# Patient Record
Sex: Female | Born: 1990 | Race: White | Hispanic: No | Marital: Married | Smoking: Never smoker
Health system: Southern US, Community
[De-identification: ages and names within clinical notes are randomized; demographics above are authoritative.]

## PROBLEM LIST (undated history)

## (undated) ENCOUNTER — Inpatient Hospital Stay (HOSPITAL_COMMUNITY): Payer: Self-pay

## (undated) DIAGNOSIS — Z789 Other specified health status: Secondary | ICD-10-CM

## (undated) DIAGNOSIS — D649 Anemia, unspecified: Secondary | ICD-10-CM

## (undated) HISTORY — PX: URETHRAL CYST REMOVAL: SHX5128

## (undated) HISTORY — PX: NO PAST SURGERIES: SHX2092

---

## 2017-04-21 ENCOUNTER — Emergency Department (HOSPITAL_COMMUNITY)
Admission: EM | Admit: 2017-04-21 | Discharge: 2017-04-21 | Disposition: A | Discharge status: Home / Self Care | Payer: BLUE CROSS/BLUE SHIELD | Attending: Emergency Medicine | Admitting: Emergency Medicine

## 2017-04-21 ENCOUNTER — Encounter (HOSPITAL_COMMUNITY): Payer: Self-pay

## 2017-04-21 DIAGNOSIS — H9202 Otalgia, left ear: Secondary | ICD-10-CM | POA: Diagnosis present

## 2017-04-21 DIAGNOSIS — H66002 Acute suppurative otitis media without spontaneous rupture of ear drum, left ear: Secondary | ICD-10-CM | POA: Insufficient documentation

## 2017-04-21 MED ORDER — AMOXICILLIN 500 MG PO CAPS
1000.0000 mg | ORAL_CAPSULE | Freq: Once | ORAL | Status: AC
  Administered 2017-04-21: 1000 mg via ORAL
  Filled 2017-04-21: qty 2

## 2017-04-21 MED ORDER — AMOXICILLIN 500 MG PO CAPS: 1000.0000 mg | ORAL_CAPSULE | Freq: Two times a day (BID) | ORAL | 0 refills | Status: DC

## 2017-04-21 MED ORDER — ACETAMINOPHEN 500 MG PO TABS
1000.0000 mg | ORAL_TABLET | Freq: Once | ORAL | Status: AC
  Administered 2017-04-21: 1000 mg via ORAL
  Filled 2017-04-21: qty 2

## 2017-04-21 MED ORDER — KETOROLAC TROMETHAMINE 60 MG/2ML IM SOLN
30.0000 mg | Freq: Once | INTRAMUSCULAR | Status: AC
  Administered 2017-04-21: 30 mg via INTRAMUSCULAR
  Filled 2017-04-21: qty 2

## 2017-04-21 NOTE — ED Notes (Signed)
Bed: [facility]PT1 Expected date:  Expected time:  Means of arrival:  Comments: 

## 2017-04-21 NOTE — ED Provider Notes (Signed)
[facility]   CSN: [id number] Arrival date & time: 04/21/17  0202     History   Chief Complaint Chief Complaint  Patient presents with  . Otalgia   HPI   Blood pressure 118/81, pulse 76, temperature 98.4 F (36.9 C), temperature source Oral, resp. rate 18, last menstrual period 04/03/2017, SpO2 99 %.  Heidi Ayala is a 26 y.o. female complaining of severe left-sided otalgia worsening over the course of the day.  She has had an upper respiratory infection for approximately 1 week but this is not been an issue previously.  She denies any fevers, chills, cough, chest pain, shortness of breath.  She took ibuprofen before coming to the ED with little relief.  No history of frequent ear infections.  History reviewed. No pertinent past medical history.  There are no active problems to display for this patient.   History reviewed. No pertinent surgical history.  OB History    No data available       Home Medications    Prior to Admission medications   Medication Sig Start Date End Date Taking? Authorizing Provider  amoxicillin (AMOXIL) 500 MG capsule Take 2 capsules (1,000 mg total) by mouth 2 (two) times daily. 04/21/17   Marco Adelson, Mardella LaymanNicole, PA-C    Family History History reviewed. No pertinent family history.  Social History Social History   Tobacco Use  . Smoking status: Never Smoker  . Smokeless tobacco: Never Used  Substance Use Topics  . Alcohol use: No    Frequency: Never  . Drug use: No     Allergies   Patient has no allergy information on record.   Review of Systems Review of Systems  A complete review of systems was obtained and all systems are negative except as noted in the HPI and PMH.    Physical Exam Updated Vital Signs BP 118/81 (BP Location: Left Arm)   Pulse 76   Temp 98.4 F (36.9 C) (Oral)   Resp 18   LMP 04/03/2017   SpO2 99%   Physical Exam  Constitutional: She appears  well-developed and well-nourished.  HENT:  Head: Normocephalic.  Right Ear: External ear normal.  Left Ear: External ear normal.  Mouth/Throat: Oropharynx is clear and moist. No oropharyngeal exudate.  No drooling or stridor. Posterior pharynx mildly erythematous no significant tonsillar hypertrophy. No exudate. Soft palate rises symmetrically. No TTP or induration under tongue.   No tenderness to palpation of frontal or bilateral maxillary sinuses.  Mild mucosal edema in the nares with scant rhinorrhea.  Normal tympanic membrane on the right with normal architecture and good light reflex, left tympanic membrane erythematous and bulging.   Eyes: Conjunctivae and EOM are normal. Pupils are equal, round, and reactive to light.  Neck: Normal range of motion. Neck supple.  Cardiovascular: Normal rate and regular rhythm.  Pulmonary/Chest: Effort normal and breath sounds normal. No stridor. No respiratory distress. She has no wheezes. She has no rales. She exhibits no tenderness.  Abdominal: Soft. There is no tenderness. There is no rebound and no guarding.  Nursing note and vitals reviewed.    ED Treatments / Results  Labs (all labs ordered are listed, but only abnormal results are displayed) Labs Reviewed - No data to display  EKG  EKG Interpretation None       Radiology No results found.  Procedures Procedures (including critical care time)  Medications Ordered in ED Medications  ketorolac (TORADOL) injection 30 mg (30 mg  Intramuscular Given 04/21/17 0257)  amoxicillin (AMOXIL) capsule 1,000 mg (1,000 mg Oral Given 04/21/17 0258)  acetaminophen (TYLENOL) tablet 1,000 mg (1,000 mg Oral Given 04/21/17 0258)     Initial Impression / Assessment and Plan / ED Course  I have reviewed the triage vital signs and the nursing notes.  Pertinent labs & imaging results that were available during my care of the patient were reviewed by me and considered in my medical decision  making (see chart for details).     Vitals:   04/21/17 0223  BP: 118/81  Pulse: 76  Resp: 18  Temp: 98.4 F (36.9 C)  TempSrc: Oral  SpO2: 99%    Medications  ketorolac (TORADOL) injection 30 mg (30 mg Intramuscular Given 04/21/17 0257)  amoxicillin (AMOXIL) capsule 1,000 mg (1,000 mg Oral Given 04/21/17 0258)  acetaminophen (TYLENOL) tablet 1,000 mg (1,000 mg Oral Given 04/21/17 0258)    Heidi Ayala is 26 y.o. female presenting with URI for 1 week with significant left-sided ear pain developing over the course of the day, stopping her from sleeping.  Physical exam consistent with acute otitis media.  Evaluation does not show pathology that would require ongoing emergent intervention or inpatient treatment. Pt is hemodynamically stable and mentating appropriately. Discussed findings and plan with patient/guardian, who agrees with care plan. All questions answered. Return precautions discussed and outpatient follow up given.   Final Clinical Impressions(s) / ED Diagnoses   Final diagnoses:  Acute suppurative otitis media of left ear without spontaneous rupture of tympanic membrane, recurrence not specified    ED Discharge Orders        Ordered    amoxicillin (AMOXIL) 500 MG capsule  2 times daily     04/21/17 0244       Evalynn Hankins, DeweyNicole, PA-C 04/21/17 0351    Geoffery Lyonselo, Douglas, MD 04/21/17 [phone]

## 2017-04-21 NOTE — ED Triage Notes (Signed)
Pt complains of left sided ear ache that started tonight after being sick this past week

## 2017-04-21 NOTE — Discharge Instructions (Signed)
For pain control please take ibuprofen (also known as Motrin or Advil) 800mg  (this is normally 4 over the counter pills) 3 times a day  for 5 days. Take with food to minimize stomach irritation.   Take acetaminophen (Tylenol) up to 975 mg (this is normally 3 over-the-counter pills) up to 3 times a day. Do not drink alcohol. Make sure your other medications do not contain acetaminophen (Read the labels!)   Take your antibiotics as directed and to completion. You should never have any leftover antibiotics! Push fluids and stay well hydrated.   Any antibiotic use can reduce the efficacy of hormonal birth control. Please use back up method of contraception.   Please follow with your primary care doctor in the next 2 days for a check-up. They must obtain records for further management.   Do not hesitate to return to the Emergency Department for any new, worsening or concerning symptoms.

## 2018-05-23 ENCOUNTER — Other Ambulatory Visit: Payer: Self-pay

## 2018-05-23 ENCOUNTER — Inpatient Hospital Stay (HOSPITAL_COMMUNITY): Payer: BLUE CROSS/BLUE SHIELD

## 2018-05-23 ENCOUNTER — Encounter (HOSPITAL_COMMUNITY): Payer: Self-pay | Admitting: *Deleted

## 2018-05-23 ENCOUNTER — Inpatient Hospital Stay (HOSPITAL_COMMUNITY)
Admission: AD | Admit: 2018-05-23 | Discharge: 2018-05-23 | Disposition: A | Discharge status: Home / Self Care | Payer: BLUE CROSS/BLUE SHIELD | Attending: Obstetrics & Gynecology | Admitting: Obstetrics & Gynecology

## 2018-05-23 DIAGNOSIS — O200 Threatened abortion: Secondary | ICD-10-CM | POA: Diagnosis not present

## 2018-05-23 DIAGNOSIS — O26899 Other specified pregnancy related conditions, unspecified trimester: Secondary | ICD-10-CM

## 2018-05-23 DIAGNOSIS — Z3A01 Less than 8 weeks gestation of pregnancy: Secondary | ICD-10-CM | POA: Insufficient documentation

## 2018-05-23 DIAGNOSIS — O209 Hemorrhage in early pregnancy, unspecified: Secondary | ICD-10-CM

## 2018-05-23 DIAGNOSIS — R109 Unspecified abdominal pain: Secondary | ICD-10-CM

## 2018-05-23 HISTORY — DX: Other specified health status: Z78.9

## 2018-05-23 LAB — URINALYSIS, ROUTINE W REFLEX MICROSCOPIC
BILIRUBIN URINE: NEGATIVE
GLUCOSE, UA: NEGATIVE mg/dL
Ketones, ur: NEGATIVE mg/dL
LEUKOCYTES UA: NEGATIVE
NITRITE: NEGATIVE
PH: 6 (ref 5.0–8.0)
Protein, ur: NEGATIVE mg/dL
Specific Gravity, Urine: 1.015 (ref 1.005–1.030)

## 2018-05-23 LAB — CBC
HEMATOCRIT: 39.4 % (ref 36.0–46.0)
Hemoglobin: 13.4 g/dL (ref 12.0–15.0)
MCH: 29.7 pg (ref 26.0–34.0)
MCHC: 34 g/dL (ref 30.0–36.0)
MCV: 87.4 fL (ref 80.0–100.0)
Platelets: 237 10*3/uL (ref 150–400)
RBC: 4.51 MIL/uL (ref 3.87–5.11)
RDW: 13 % (ref 11.5–15.5)
WBC: 5.6 10*3/uL (ref 4.0–10.5)
nRBC: 0 % (ref 0.0–0.2)

## 2018-05-23 LAB — POCT PREGNANCY, URINE: Preg Test, Ur: POSITIVE — AB

## 2018-05-23 LAB — ABO/RH: ABO/RH(D): O POS

## 2018-05-23 LAB — HCG, QUANTITATIVE, PREGNANCY: HCG, BETA CHAIN, QUANT, S: 11 m[IU]/mL — AB (ref <5)

## 2018-05-23 NOTE — MAU Note (Signed)
Woke up this morning, saw blood when she pees.   Then was pinkish to brown.  Been cramping since woke up.  Called doc. When got to work, started having bright red blood like a period.  +HPT.  Has appt scheduled

## 2018-05-23 NOTE — Discharge Instructions (Signed)
Threatened Miscarriage  A threatened miscarriage occurs when a woman has vaginal bleeding during the first 20 weeks of pregnancy but the pregnancy has not ended. If you have vaginal bleeding during this time, your health care provider will do tests to make sure you are still pregnant. If the tests show that you are still pregnant and that the developing baby (fetus) inside your uterus is still growing, your condition is considered a threatened miscarriage. A threatened miscarriage does not mean your pregnancy will end, but it does increase the risk of losing your pregnancy (complete miscarriage). What are the causes? The cause of this condition is usually not known. For women who go on to have a complete miscarriage, the most common cause is an abnormal number of chromosomes in the developing baby. Chromosomes are the structures inside cells that hold all of a person's genetic material. What increases the risk? The following lifestyle factors may increase your risk of a miscarriage in early pregnancy:  Smoking.  Drinking excessive amounts of alcohol or caffeine.  Recreational drug use. The following preexisting health conditions may increase your risk of a miscarriage in early pregnancy:  Polycystic ovary syndrome.  Uterine fibroids.  Infections.  Diabetes mellitus. What are the signs or symptoms? Symptoms of this condition include:  Vaginal bleeding.  Mild abdominal pain or cramps. How is this diagnosed? If you have bleeding with or without abdominal pain before 20 weeks of pregnancy, your health care provider will do tests to check whether you are still pregnant. These will include:  Ultrasound. This test uses sound waves to create images of the inside of your uterus. This allows your health care provider to look at your developing baby and other structures, such as your placenta.  Pelvic exam. This is an internal exam of your vagina and cervix.  Measurement of your baby's heart  rate.  Laboratory tests such as blood tests, urine tests, or swabs for infection You may be diagnosed with a threatened miscarriage if:  Ultrasound testing shows that you are still pregnant.  Your baby's heart rate is strong.  A pelvic exam shows that the opening between your uterus and your vagina (cervix) is closed.  Blood tests confirm that you are still pregnant. How is this treated? No treatments have been shown to prevent a threatened miscarriage from going on to a complete miscarriage. However, the right home care is important. Follow these instructions at home:  Get plenty of rest.  Do not have sex or use tampons if you have vaginal bleeding.  Do not douche.  Do not smoke or use recreational drugs.  Do not drink alcohol.  Avoid caffeine.  Keep all follow-up prenatal visits as told by your health care provider. This is important. Contact a health care provider if:  You have light vaginal bleeding or spotting while pregnant.  You have abdominal pain or cramping.  You have a fever. Get help right away if:  You have heavy vaginal bleeding.  You have blood clots coming from your vagina.  You pass tissue from your vagina.  You leak fluid, or you have a gush of fluid from your vagina.  You have severe low back pain or abdominal cramps.  You have fever, chills, and severe abdominal pain. Summary  A threatened miscarriage occurs when a woman has vaginal bleeding during the first 20 weeks of pregnancy but the pregnancy has not ended.  The cause of a threatened miscarriage is usually not known.  Symptoms of this condition may   include vaginal bleeding and mild abdominal pain or cramps.  No treatments have been shown to prevent a threatened miscarriage from going on to a complete miscarriage.  Keep all follow-up prenatal visits as told by your health care provider. This is important. This information is not intended to replace advice given to you by your health  care provider. Make sure you discuss any questions you have with your health care provider. Document Released: 04/23/2005 Document Revised: 07/20/2016 Document Reviewed: 07/20/2016 Elsevier Interactive Patient Education  2019 [employer]  

## 2018-05-23 NOTE — MAU Provider Note (Signed)
Chief Complaint: Vaginal Bleeding; Abdominal Pain; and Possible Pregnancy   First Provider Initiated Contact with Patient 05/23/18 1034     SUBJECTIVE HPI: Heidi Ayala is a 28 y.o. G2P1001 at [redacted]w[redacted]d who presents to [facility] reporting vaginal bleeding & abdominal cramping. Symptoms started this morning. Was initially just bright red spotting on toilet paper but has increased and is now bleeding through underwear. Not passing clots. Reports intermittent lower abdominal cramping. Has not been seen with prenatal care yet. Has first OB appt at [facility] next month.   Location: lower abdomen Quality: cramping Severity: 3/10 on pain scale Duration: <1 day Timing: intermittent Modifying factors: none Associated signs and symptoms: vaginal bleeding  Past Medical History:  Diagnosis Date  . Medical history non-contributory    OB History  Gravida Para Term Preterm AB Living  2 1 1     1   SAB TAB Ectopic Multiple Live Births               # Outcome Date GA Lbr Len/2nd Weight Sex Delivery Anes PTL Lv  2 Current           1 Term 03/2013 [redacted]w[redacted]d    Vag-Spont      Past Surgical History:  Procedure Laterality Date  . NO PAST SURGERIES     Social History   Socioeconomic History  . Marital status: Married    Spouse name: Not on file  . Number of children: Not on file  . Years of education: Not on file  . Highest education level: Not on file  Occupational History  . Not on file  Social Needs  . Financial resource strain: Not on file  . Food insecurity:    Worry: Not on file    Inability: Not on file  . Transportation needs:    Medical: Not on file    Non-medical: Not on file  Tobacco Use  . Smoking status: Never Smoker  . Smokeless tobacco: Never Used  Substance and Sexual Activity  . Alcohol use: No    Frequency: Never  . Drug use: No  . Sexual activity: Yes  Lifestyle  . Physical activity:    Days per week: Not on file    Minutes per session: Not on  file  . Stress: Not on file  Relationships  . Social connections:    Talks on phone: Not on file    Gets together: Not on file    Attends religious service: Not on file    Active member of club or organization: Not on file    Attends meetings of clubs or organizations: Not on file    Relationship status: Not on file  . Intimate partner violence:    Fear of current or ex partner: Not on file    Emotionally abused: Not on file    Physically abused: Not on file    Forced sexual activity: Not on file  Other Topics Concern  . Not on file  Social History Narrative  . Not on file   No family history on file. No current facility-administered medications on file prior to encounter.    No current outpatient medications on file prior to encounter.   No Known Allergies  I have reviewed patient's Past Medical Hx, Surgical Hx, Family Hx, Social Hx, medications and allergies.   Review of Systems  Constitutional: Negative.   Gastrointestinal: Positive for abdominal pain. Negative for constipation, diarrhea, nausea and vomiting.  Genitourinary: Positive for vaginal bleeding.  OBJECTIVE Patient Vitals for the past 24 hrs:  BP Temp Temp src Pulse Resp SpO2 Height Weight  05/23/18 1246 (!) 106/59 - - 74 - - - -  05/23/18 0959 119/85 98.4 F (36.9 C) Oral 76 17 100 % 5\' 5"  (1.651 m) 58.4 kg   Constitutional: Well-developed, well-nourished female in no acute distress.  Cardiovascular: normal rate & rhythm, no murmur Respiratory: normal rate and effort. Lung sounds clear throughout GI: Abd soft, non-tender, Pos BS x 4. No guarding or rebound tenderness MS: Extremities nontender, no edema, normal ROM Neurologic: Alert and oriented x 4.  GU:     SPECULUM EXAM: NEFG, small amount of dark red blood  BIMANUAL: No CMT. cervix closed; uterus normal size, no adnexal tenderness or masses.    LAB RESULTS Results for orders placed or performed during the hospital encounter of 05/23/18 (from the  past 24 hour(s))  Urinalysis, Routine w reflex microscopic     Status: Abnormal   Collection Time: 05/23/18 10:06 AM  Result Value Ref Range   Color, Urine YELLOW YELLOW   APPearance HAZY (A) CLEAR   Specific Gravity, Urine 1.015 1.005 - 1.030   pH 6.0 5.0 - 8.0   Glucose, UA NEGATIVE NEGATIVE mg/dL   Hgb urine dipstick LARGE (A) NEGATIVE   Bilirubin Urine NEGATIVE NEGATIVE   Ketones, ur NEGATIVE NEGATIVE mg/dL   Protein, ur NEGATIVE NEGATIVE mg/dL   Nitrite NEGATIVE NEGATIVE   Leukocytes, UA NEGATIVE NEGATIVE   RBC / HPF >50 (H) 0 - 5 RBC/hpf   WBC, UA 0-5 0 - 5 WBC/hpf   Bacteria, UA RARE (A) NONE SEEN   Squamous Epithelial / LPF 0-5 0 - 5   Mucus PRESENT   Pregnancy, urine POC     Status: Abnormal   Collection Time: 05/23/18 10:19 AM  Result Value Ref Range   Preg Test, Ur POSITIVE (A) NEGATIVE  CBC     Status: None   Collection Time: 05/23/18 10:49 AM  Result Value Ref Range   WBC 5.6 4.0 - 10.5 K/uL   RBC 4.51 3.87 - 5.11 MIL/uL   Hemoglobin 13.4 12.0 - 15.0 g/dL   HCT 09.839.4 11.936.0 - 14.746.0 %   MCV 87.4 80.0 - 100.0 fL   MCH 29.7 26.0 - 34.0 pg   MCHC 34.0 30.0 - 36.0 g/dL   RDW 82.913.0 56.211.5 - 13.015.5 %   Platelets 237 150 - 400 K/uL   nRBC 0.0 0.0 - 0.2 %  ABO/Rh     Status: None   Collection Time: 05/23/18 10:49 AM  Result Value Ref Range   ABO/RH(D)      O POS Performed at [facility], [address] [place], [place] [id number]   hCG, quantitative, pregnancy     Status: Abnormal   Collection Time: 05/23/18 10:49 AM  Result Value Ref Range   hCG, Beta Chain, Quant, S 11 (H) <5 mIU/mL    IMAGING [place] Ob Less Than 14 Weeks With Ob Transvaginal  Result Date: 05/23/2018 CLINICAL DATA:  Pelvic pain and cramping affecting pregnancy. Vaginal bleeding in 1st trimester pregnancy. EXAM: OBSTETRIC <14 WK US AND TRANSVAGINAL OB US TECHNIQUE: Both transabdominal and transvaginal ultrasound examinations were performed for complete evaluation of the gestation as well as the  maternal uterus, adnexal regions, and pelvic cul-de-sac. Transvaginal technique was performed to assess early pregnancy. COMPARISON:  None. FINDINGS: Intrauterine gestational sac: None Maternal uterus/adnexae: Uterus is retroverted. Endometrial thickness measures 5 mm. Both ovaries are normal in appearance.  No mass or abnormal free fluid identified. IMPRESSION: Pregnancy of unknown anatomic location (no intrauterine gestational sac or adnexal mass identified). Differential diagnosis includes recent spontaneous abortion, IUP too early to visualize, and non-visualized ectopic pregnancy. Recommend correlation with serial beta-hCG levels, and follow up US if warranted clinically. Electronically Signed   By: Myles Rosenthal M.D.   On: 05/23/2018 11:51    MAU COURSE Orders Placed This Encounter  Procedures  . US OB LESS THAN 14 WEEKS WITH OB TRANSVAGINAL  . Urinalysis, Routine w reflex microscopic  . CBC  . hCG, quantitative, pregnancy  . Pregnancy, urine POC  . ABO/Rh  . Discharge patient   No orders of the defined types were placed in this encounter.   MDM +UPT UA, CBC, ABO/Rh, quant hCG, and [place] today to rule out ectopic pregnancy  Ultrasound shows no IUP or adnexal mass. HCG= 11. Discussed with patient that this is concerning for miscarriage as she had a positive HPT earlier in the week. Will bring back on Sunday for repeat HCG.  RH positive  ASSESSMENT 1. Threatened miscarriage   2. Vaginal bleeding in pregnancy, first trimester   3. Abdominal cramping affecting pregnancy     PLAN Discharge home in stable condition. SAB vs ectopic precautions Follow-up Information    [facility]. Go on 05/25/2018.   Specialty:  Obstetrics and Gynecology Contact information: [address] [id number] mc [place] [address] [phone]         Allergies as of 05/23/2018   No Known Allergies     Medication List    STOP taking these medications    amoxicillin 500 MG capsule Commonly known as:  Paulita Cradle, NP 05/23/2018  4:25 PM

## 2018-05-25 ENCOUNTER — Inpatient Hospital Stay (HOSPITAL_COMMUNITY)
Admission: AD | Admit: 2018-05-25 | Discharge: 2018-05-25 | Disposition: A | Discharge status: Home / Self Care | Payer: BLUE CROSS/BLUE SHIELD | Source: Ambulatory Visit | Attending: Obstetrics & Gynecology | Admitting: Obstetrics & Gynecology

## 2018-05-25 DIAGNOSIS — O039 Complete or unspecified spontaneous abortion without complication: Secondary | ICD-10-CM | POA: Insufficient documentation

## 2018-05-25 DIAGNOSIS — O0281 Inappropriate change in quantitative human chorionic gonadotropin (hCG) in early pregnancy: Secondary | ICD-10-CM

## 2018-05-25 LAB — HCG, QUANTITATIVE, PREGNANCY: HCG, BETA CHAIN, QUANT, S: 3 m[IU]/mL (ref <5)

## 2018-05-25 NOTE — MAU Note (Signed)
Here for follow up HCG  No pain or bleeding today

## 2018-05-25 NOTE — Discharge Instructions (Signed)
Miscarriage  A miscarriage is the loss of an unborn baby (fetus) before the 20th week of pregnancy. Most miscarriages happen during the first 3 months of pregnancy. Sometimes, a miscarriage can happen before a woman knows that she is pregnant.  Having a miscarriage can be an emotional experience. If you have had a miscarriage, talk with your health care provider about any questions you may have about miscarrying, the grieving process, and your plans for future pregnancy.  What are the causes?  A miscarriage may be caused by:  · Problems with the genes or chromosomes of the fetus. These problems make it impossible for the baby to develop normally. They are often the result of random errors that occur early in the development of the baby, and are not passed from parent to child (not inherited).  · Infection of the cervix or uterus.  · Conditions that affect hormone balance in the body.  · Problems with the cervix, such as the cervix opening and thinning before pregnancy is at term (cervical insufficiency).  · Problems with the uterus. These may include:  ? A uterus with an abnormal shape.  ? Fibroids in the uterus.  ? Congenital abnormalities. These are problems that were present at birth.  · Certain medical conditions.  · Smoking, drinking alcohol, or using drugs.  · Injury (trauma).  In many cases, the cause of a miscarriage is not known.  What are the signs or symptoms?  Symptoms of this condition include:  · Vaginal bleeding or spotting, with or without cramps or pain.  · Pain or cramping in the abdomen or lower back.  · Passing fluid, tissue, or blood clots from the vagina.  How is this diagnosed?  This condition may be diagnosed based on:  · A physical exam.  · Ultrasound.  · Blood tests.  · Urine tests.  How is this treated?  Treatment for a miscarriage is sometimes not necessary if you naturally pass all the tissue that was in your uterus. If necessary, this condition may be treated with:  · Dilation and  curettage (D&C). This is a procedure in which the cervix is stretched open and the lining of the uterus (endometrium) is scraped. This is done only if tissue from the fetus or placenta remains in the body (incomplete miscarriage).  · Medicines, such as:  ? Antibiotic medicine, to treat infection.  ? Medicine to help the body pass any remaining tissue.  ? Medicine to reduce (contract) the size of the uterus. These medicines may be given if you have a lot of bleeding.  If you have Rh negative blood and your baby was Rh positive, you will need a shot of a medicine called Rh immunoglobulinto protect your future babies from Rh blood problems. "Rh-negative" and "Rh-positive" refer to whether or not the blood has a specific protein found on the surface of red blood cells (Rh factor).  Follow these instructions at home:  Medicines    · Take over-the-counter and prescription medicines only as told by your health care provider.  · If you were prescribed antibiotic medicine, take it as told by your health care provider. Do not stop taking the antibiotic even if you start to feel better.  · Do not take NSAIDs, such as aspirin and ibuprofen, unless they are approved by your health care provider. These medicines can cause bleeding.  Activity  · Rest as directed. Ask your health care provider what activities are safe for you.  · Have someone   help with home and family responsibilities during this time.  General instructions  · Keep track of the number of sanitary pads you use each day and how soaked (saturated) they are. Write down this information.  · Monitor the amount of tissue or blood clots that you pass from your vagina. Save any large amounts of tissue for your health care provider to examine.  · Do not use tampons, douche, or have sex until your health care provider approves.  · To help you and your partner with the process of grieving, talk with your health care provider or seek counseling.  · When you are ready, meet with  your health care provider to discuss any important steps you should take for your health. Also, discuss steps you should take to have a healthy pregnancy in the future.  · Keep all follow-up visits as told by your health care provider. This is important.  Where to find more information  · The American Congress of Obstetricians and Gynecologists: www.acog.org  · U.S. Department of Health and Human Services Office of Women’s Health: www.womenshealth.gov  Contact a health care provider if:  · You have a fever or chills.  · You have a foul smelling vaginal discharge.  · You have more bleeding instead of less.  Get help right away if:  · You have severe cramps or pain in your back or abdomen.  · You pass blood clots or tissue from your vagina that is walnut-sized or larger.  · You soak more than 1 regular sanitary pad in an hour.  · You become light-headed or weak.  · You pass out.  · You have feelings of sadness that take over your thoughts, or you have thoughts of hurting yourself.  Summary  · Most miscarriages happen in the first 3 months of pregnancy. Sometimes miscarriage happens before a woman even knows that she is pregnant.  · Follow your health care provider's instruction for home care. Keep all follow-up appointments.  · To help you and your partner with the process of grieving, talk with your health care provider or seek counseling.  This information is not intended to replace advice given to you by your health care provider. Make sure you discuss any questions you have with your health care provider.  Document Released: 10/17/2000 Document Revised: 05/29/2016 Document Reviewed: 05/29/2016  Elsevier Interactive Patient Education © 2019 Elsevier Inc.

## 2018-05-25 NOTE — MAU Provider Note (Signed)
Subjective:  Heidi Ayala is a 28 y.o. G2P1001 at [redacted]w[redacted]d who presents today for FU BHCG. She was seen on 05/23/18. Results from that day show no IUP on [place], and HCG of 11 . She denies vaginal bleeding. She denies abdominal or pelvic pain.  Objective:  Physical Exam  Nursing note and vitals reviewed. Constitutional: She is oriented to person, place, and time. She appears well-developed and well-nourished. No distress.  HENT:  Head: Normocephalic.  Cardiovascular: Normal rate.  Respiratory: Effort normal.  GI: Soft. There is no tenderness.  Neurological: She is alert and oriented to person, place, and time. Skin: Skin is warm and dry.  Psychiatric: She has a normal mood and affect.   Results for orders placed or performed during the hospital encounter of 05/25/18 (from the past 24 hour(s))  hCG, quantitative, pregnancy     Status: None   Collection Time: 05/25/18  1:50 PM  Result Value Ref Range   hCG, Beta Chain, Quant, S 3 <5 mIU/mL    Assessment/Plan: SAB HCG did not rise appropriately FU in as needed, if symptoms worsen Support given Continue prenatal vitamins as they desire pregnancy. O positive blood type    Venia Carbon I, NP 05/25/2018 3:06 PM

## 2019-01-27 LAB — OB RESULTS CONSOLE HIV ANTIBODY (ROUTINE TESTING): HIV: NONREACTIVE

## 2019-01-27 LAB — OB RESULTS CONSOLE GC/CHLAMYDIA
Chlamydia: NEGATIVE
Gonorrhea: NEGATIVE

## 2019-01-27 LAB — OB RESULTS CONSOLE ABO/RH: RH Type: POSITIVE

## 2019-01-27 LAB — OB RESULTS CONSOLE HEPATITIS B SURFACE ANTIGEN: Hepatitis B Surface Ag: NEGATIVE

## 2019-01-27 LAB — OB RESULTS CONSOLE RPR: RPR: NONREACTIVE

## 2019-01-27 LAB — OB RESULTS CONSOLE ANTIBODY SCREEN: Antibody Screen: NEGATIVE

## 2019-01-27 LAB — OB RESULTS CONSOLE RUBELLA ANTIBODY, IGM: Rubella: NON-IMMUNE/NOT IMMUNE

## 2019-04-15 ENCOUNTER — Encounter (HOSPITAL_COMMUNITY): Payer: Self-pay

## 2019-07-20 ENCOUNTER — Encounter (HOSPITAL_COMMUNITY): Payer: Self-pay | Admitting: Obstetrics and Gynecology

## 2019-07-20 ENCOUNTER — Inpatient Hospital Stay (HOSPITAL_COMMUNITY)
Admission: AD | Admit: 2019-07-20 | Discharge: 2019-07-20 | Disposition: A | Discharge status: Home / Self Care | Payer: BC Managed Care – PPO | Attending: Obstetrics and Gynecology | Admitting: Obstetrics and Gynecology

## 2019-07-20 ENCOUNTER — Other Ambulatory Visit: Payer: Self-pay

## 2019-07-20 DIAGNOSIS — O368130 Decreased fetal movements, third trimester, not applicable or unspecified: Secondary | ICD-10-CM | POA: Diagnosis present

## 2019-07-20 DIAGNOSIS — R102 Pelvic and perineal pain: Secondary | ICD-10-CM

## 2019-07-20 DIAGNOSIS — N949 Unspecified condition associated with female genital organs and menstrual cycle: Secondary | ICD-10-CM

## 2019-07-20 DIAGNOSIS — Z3A33 33 weeks gestation of pregnancy: Secondary | ICD-10-CM | POA: Diagnosis not present

## 2019-07-20 DIAGNOSIS — Z3689 Encounter for other specified antenatal screening: Secondary | ICD-10-CM

## 2019-07-20 DIAGNOSIS — O368131 Decreased fetal movements, third trimester, fetus 1: Secondary | ICD-10-CM | POA: Diagnosis not present

## 2019-07-20 LAB — URINALYSIS, ROUTINE W REFLEX MICROSCOPIC
Bilirubin Urine: NEGATIVE
Glucose, UA: NEGATIVE mg/dL
Hgb urine dipstick: NEGATIVE
Ketones, ur: NEGATIVE mg/dL
Leukocytes,Ua: NEGATIVE
Nitrite: NEGATIVE
Protein, ur: NEGATIVE mg/dL
Specific Gravity, Urine: 1.008 (ref 1.005–1.030)
pH: 6 (ref 5.0–8.0)

## 2019-07-20 LAB — WET PREP, GENITAL
Clue Cells Wet Prep HPF POC: NONE SEEN
Sperm: NONE SEEN
Trich, Wet Prep: NONE SEEN
Yeast Wet Prep HPF POC: NONE SEEN

## 2019-07-20 LAB — FETAL FIBRONECTIN: Fetal Fibronectin: NEGATIVE

## 2019-07-20 NOTE — MAU Provider Note (Signed)
History     CSN: [id number]  Arrival date and time: 07/20/19 2018   First Provider Initiated Contact with Patient 07/20/19 2105      Chief Complaint  Patient presents with  . Pelvic Pain  . Decreased Fetal Movement   Heidi Ayala is a 29 y.o. G3P1011 at [redacted]w[redacted]d who receives care at [facility].  She presents today for Pelvic Pain and Decreased Fetal Movement. She states she has been having pelvic pressure since about 330-4pm as well as left side abdominal cramping.  She states the pain has been constant and states the pressure is "a heavy" and reports the cramp does not radiate. She reports walking aggravates her symptoms, while she has found nothing to relieve them.  She rates the pain a 4-5/10.  She reports that she was experiencing DFM prior to arrival, but now she is moving.  She states "it has been several times" when asked about movement since arrival.   Patient states she had tried drinking fluids and pushing abdomen with no response.  She reports some "tightness" in the area of the cramping since arrival.  She denies vaginal discharge, bleeding, or leaking.  She also denies sexual activity in the past 3 days. Patient feels that she hasn't had enough water today.      OB History    Gravida  3   Para  1   Term  1   Preterm      AB  1   Living  1     SAB  1   TAB      Ectopic      Multiple      Live Births              Past Medical History:  Diagnosis Date  . Medical history non-contributory     Past Surgical History:  Procedure Laterality Date  . NO PAST SURGERIES    . URETHRAL CYST REMOVAL      History reviewed. No pertinent family history.  Social History   Tobacco Use  . Smoking status: Never Smoker  . Smokeless tobacco: Never Used  Substance Use Topics  . Alcohol use: No  . Drug use: No    Allergies: No Known Allergies  Medications Prior to Admission  Medication Sig Dispense Refill Last Dose  . Prenatal Vit-Fe  Fumarate-FA (PRENATAL MULTIVITAMIN) TABS tablet Take 1 tablet by mouth daily at 12 noon.       Review of Systems  Constitutional: Negative for chills and fever.  Respiratory: Negative for cough and shortness of breath.   Gastrointestinal: Negative for abdominal pain, nausea and vomiting.  Genitourinary: Positive for pelvic pain. Negative for difficulty urinating, dysuria, vaginal bleeding and vaginal discharge.  Neurological: Negative for dizziness, light-headedness and headaches.   Physical Exam   Blood pressure 112/61, pulse 69, temperature 98.2 F (36.8 C), resp. rate 17, weight 72.7 kg, unknown if currently breastfeeding.  Physical Exam  Constitutional: She is oriented to person, place, and time. She appears well-developed and well-nourished. No distress.  HENT:  Head: Normocephalic and atraumatic.  Eyes: Conjunctivae are normal.  Cardiovascular: Normal rate, regular rhythm and normal heart sounds.  Respiratory: Effort normal and breath sounds normal. No respiratory distress.  GI: Soft.  Genitourinary: There is no lesion on the right labia. There is no lesion on the left labia. Cervix exhibits no motion tenderness, no discharge and no friability.    Vaginal discharge (Small amt thin white) present.  No vaginal bleeding.  No bleeding in the vagina.    Genitourinary Comments: Speculum Exam: -Normal External Genitalia: Non tender, no apparent discharge at introitus.  -Vaginal Vault: Pink mucosa with good rugae. Small amt thin white discharge.  fFN collected from posterior fornix. Wet prep collected -Cervix:Pink, no lesions, cysts, or polyps.  Appears closed. No active bleeding from os-GC/CT collected -Bimanual Exam:  Cervix closed, but feels shortened.     Musculoskeletal:        General: Normal range of motion.     Cervical back: Normal range of motion.  Neurological: She is alert and oriented to person, place, and time.  Skin: Skin is warm and dry.  Psychiatric: She has a  normal mood and affect.    Fetal Assessment 135 bpm, Mod Var, -Decels, +Accels Toco: CTx Q 1-50min, palpates mild  MAU Course   Results for orders placed or performed during the hospital encounter of 07/20/19 (from the past 24 hour(s))  Urinalysis, Routine w reflex microscopic     Status: None   Collection Time: 07/20/19  8:31 PM  Result Value Ref Range   Color, Urine YELLOW YELLOW   APPearance CLEAR CLEAR   Specific Gravity, Urine 1.008 1.005 - 1.030   pH 6.0 5.0 - 8.0   Glucose, UA NEGATIVE NEGATIVE mg/dL   Hgb urine dipstick NEGATIVE NEGATIVE   Bilirubin Urine NEGATIVE NEGATIVE   Ketones, ur NEGATIVE NEGATIVE mg/dL   Protein, ur NEGATIVE NEGATIVE mg/dL   Nitrite NEGATIVE NEGATIVE   Leukocytes,Ua NEGATIVE NEGATIVE  Wet prep, genital     Status: Abnormal   Collection Time: 07/20/19  9:25 PM   Specimen: Vaginal  Result Value Ref Range   Yeast Wet Prep HPF POC NONE SEEN NONE SEEN   Trich, Wet Prep NONE SEEN NONE SEEN   Clue Cells Wet Prep HPF POC NONE SEEN NONE SEEN   WBC, Wet Prep HPF POC MANY (A) NONE SEEN   Sperm NONE SEEN   Fetal fibronectin     Status: None   Collection Time: 07/20/19  9:25 PM  Result Value Ref Range   Fetal Fibronectin NEGATIVE NEGATIVE   No results found.  MDM PE Labs: GC/CT, Wet Prep, fFN EFM Assessment and Plan  29 year old G3P1011  SIUP at 40.4weeks Cat I FT Pelvic Pressure Contractions  -POC reviewed. -Exam performed and findings discussed. -Cultures collected and sent. -Patient offered and declines pain medication. -BP too low for procardia dosing.  -Will give fluids for oral hydration.   -Appt Wednesday in [place] MSN, CNM 07/20/2019, 9:05 PM   Reassessment (10:08 PM)  -Ctx now every 2-3 min -Labs return negative.  -In room to discuss results with patient. -Reports cramping remains the same. -Reviewed PTL precautions. -Encouraged to go home and rest as well as consider tylenol and/or benadryl dosing tonight  to promote sleep. -Further encouraged to reduce activities tomorrow as patient admits to having a busy day today. -No other questions or concerns. -Instructed to keep appt as scheduled for Wednesday and return to MAU if symptoms worsen, even if only slightly.  -Discharged to home in stable condition.  Maryann Conners MSN, CNM Advanced Practice Provider, [facility]

## 2019-07-20 NOTE — MAU Note (Signed)
Patient reports pelvic pressure that started around 1530 and some left lower abdominal cramping.  Denies VB/LOF.  Also reports decreased fetal movement but states she has picked up since arriving to MAU.  Denies abnormal discharge.

## 2019-07-20 NOTE — Discharge Instructions (Signed)
Preventing Preterm Birth Preterm birth is when your baby is delivered between 20 weeks and 37 weeks of pregnancy. A full-term pregnancy lasts for at least 37 weeks. Preterm birth can be dangerous for your baby because the last few weeks of pregnancy are an important time for your baby's brain and lungs to grow. Many things can cause a baby to be born early. Sometimes the cause is not known. There are certain factors that make you more likely to experience preterm birth, such as:  Having a previous baby born preterm.  Being pregnant with twins or other multiples.  Having had fertility treatment.  Being overweight or underweight at the start of your pregnancy.  Having any of the following during pregnancy: ? An infection, including a urinary tract infection (UTI) or an STI (sexually transmitted infection). ? High blood pressure. ? Diabetes. ? Vaginal bleeding.  Being age 35 or older.  Being age 18 or younger.  Getting pregnant within 6 months of a previous pregnancy.  Suffering extreme stress or physical or emotional abuse during pregnancy.  Standing for long periods of time during pregnancy, such as working at a job that requires standing. What are the risks? The most serious risk of preterm birth is that the baby may not survive. This is more likely to happen if a baby is born before 34 weeks. Other risks and complications of preterm birth may include your baby having:  Breathing problems.  Brain damage that affects movement and coordination (cerebral palsy).  Feeding difficulties.  Vision or hearing problems.  Infections or inflammation of the digestive tract (colitis).  Developmental delays.  Learning disabilities.  Higher risk for diabetes, heart disease, and high blood pressure later in life. What can I do to lower my risk?  Medical care The most important thing you can do to lower your risk for preterm birth is to get routine medical care during pregnancy (prenatal  care). If you have a high risk of preterm birth, you may be referred to a health care provider who specializes in managing high-risk pregnancies (perinatologist). You may be given medicine to help prevent preterm birth. Lifestyle changes Certain lifestyle changes can also lower your risk of preterm birth:  Wait at least 6 months after a pregnancy to become pregnant again.  Try to plan pregnancy for when you are between 19 and 35 years old.  Get to a healthy weight before getting pregnant. If you are overweight, work with your health care provider to safely lose weight.  Do not use any products that contain nicotine or tobacco, such as cigarettes and e-cigarettes. If you need help quitting, ask your health care provider.  Do not drink alcohol.  Do not use drugs. Where to find support For more support, consider:  Talking with your health care provider.  Talking with a therapist or substance abuse counselor, if you need help quitting.  Working with a diet and nutrition specialist (dietitian) or a personal trainer to maintain a healthy weight.  Joining a support group. Where to find more information Learn more about preventing preterm birth from:  Centers for Disease Control and Prevention: cdc.gov/reproductivehealth/maternalinfanthealth/pretermbirth.htm  March of Dimes: marchofdimes.org/complications/premature-babies.aspx  American Pregnancy Association: americanpregnancy.org/labor-and-birth/premature-labor Contact a health care provider if:  You have any of the following signs of preterm labor before 37 weeks: ? A change or increase in vaginal discharge. ? Fluid leaking from your vagina. ? Pressure or cramps in your lower abdomen. ? A backache that does not go away or gets worse. ?   Regular tightening (contractions) in your lower abdomen. Summary  Preterm birth means having your baby during weeks 20-37 of pregnancy.  Preterm birth may put your baby at risk for physical and  mental problems.  Getting good prenatal care can help prevent preterm birth.  You can lower your risk of preterm birth by making certain lifestyle changes, such as not smoking and not using alcohol. This information is not intended to replace advice given to you by your health care provider. Make sure you discuss any questions you have with your health care provider. Document Revised: 04/05/2017 Document Reviewed: 12/31/2015 Elsevier Patient Education  2020 Elsevier Inc.  

## 2019-07-22 LAB — GC/CHLAMYDIA PROBE AMP (~~LOC~~) NOT AT ARMC
Chlamydia: NEGATIVE
Comment: NEGATIVE
Comment: NORMAL
Neisseria Gonorrhea: NEGATIVE

## 2019-08-04 LAB — OB RESULTS CONSOLE GBS: GBS: NEGATIVE

## 2019-08-27 ENCOUNTER — Encounter (HOSPITAL_COMMUNITY): Payer: Self-pay | Admitting: *Deleted

## 2019-08-27 ENCOUNTER — Telehealth (HOSPITAL_COMMUNITY): Payer: Self-pay | Admitting: *Deleted

## 2019-08-27 NOTE — Telephone Encounter (Signed)
Preadmission screen  

## 2019-08-31 ENCOUNTER — Other Ambulatory Visit (HOSPITAL_COMMUNITY)
Admission: RE | Admit: 2019-08-31 | Discharge: 2019-08-31 | Disposition: A | Discharge status: Home / Self Care | Payer: BC Managed Care – PPO | Source: Ambulatory Visit | Attending: Obstetrics and Gynecology | Admitting: Obstetrics and Gynecology

## 2019-08-31 DIAGNOSIS — Z01812 Encounter for preprocedural laboratory examination: Secondary | ICD-10-CM | POA: Insufficient documentation

## 2019-08-31 DIAGNOSIS — Z20822 Contact with and (suspected) exposure to covid-19: Secondary | ICD-10-CM | POA: Insufficient documentation

## 2019-08-31 LAB — SARS CORONAVIRUS 2 (TAT 6-24 HRS): SARS Coronavirus 2: NEGATIVE

## 2019-09-01 NOTE — H&P (Signed)
Heidi Ayala is a 29 y.o. female presenting for two stage IOL at term. Pregnancy uncomplicated. OB History    Gravida  3   Para  1   Term  1   Preterm      AB  1   Living  1     SAB  1   TAB      Ectopic      Multiple      Live Births             Past Medical History:  Diagnosis Date  . Medical history non-contributory    Past Surgical History:  Procedure Laterality Date  . NO PAST SURGERIES    . URETHRAL CYST REMOVAL     Family History: family history includes Cancer in her paternal aunt and paternal grandfather; Hypertension in her maternal grandmother. Social History:  reports that she has never smoked. She has never used smokeless tobacco. She reports that she does not drink alcohol or use drugs.     Maternal Diabetes: No Genetic Screening: Normal Maternal Ultrasounds/Referrals: Normal Fetal Ultrasounds or other Referrals:  None Maternal Substance Abuse:  No Significant Maternal Medications:  None Significant Maternal Lab Results:  Group B Strep negative Other Comments:  None  Review of Systems  Eyes: Negative for visual disturbance.  Gastrointestinal: Negative for abdominal pain.  Neurological: Negative for headaches.   Maternal Medical History:  Fetal activity: Perceived fetal activity is normal.        unknown if currently breastfeeding. Maternal Exam:  Abdomen: Fetal presentation: vertex     Physical Exam  Cardiovascular: Normal rate.  Respiratory: Effort normal.  GI: Soft.    Cx 2/50/-2/vtx  Prenatal labs: ABO, Rh: O/Positive/-- (09/22 0000) Antibody: Negative (09/22 0000) Rubella: Nonimmune (09/22 0000) RPR: Nonreactive (09/22 0000)  HBsAg: Negative (09/22 0000)  HIV: Non-reactive (09/22 0000)  GBS: Negative/-- (03/30 0000)   Assessment/Plan: 29 yo G2P1 at term for two stage IOL IOL D/W patient   Roselle Locus II 09/01/2019, 2:03 PM

## 2019-09-02 ENCOUNTER — Inpatient Hospital Stay (HOSPITAL_COMMUNITY): Payer: BC Managed Care – PPO | Admitting: Anesthesiology

## 2019-09-02 ENCOUNTER — Encounter (HOSPITAL_COMMUNITY): Payer: Self-pay | Admitting: Obstetrics and Gynecology

## 2019-09-02 ENCOUNTER — Inpatient Hospital Stay (HOSPITAL_COMMUNITY): Payer: BC Managed Care – PPO

## 2019-09-02 ENCOUNTER — Other Ambulatory Visit: Payer: Self-pay

## 2019-09-02 ENCOUNTER — Inpatient Hospital Stay (HOSPITAL_COMMUNITY)
Admission: AD | Admit: 2019-09-02 | Discharge: 2019-09-04 | DRG: 807 | Disposition: A | Discharge status: Home / Self Care | Payer: BC Managed Care – PPO | Attending: Obstetrics and Gynecology | Admitting: Obstetrics and Gynecology

## 2019-09-02 DIAGNOSIS — Z3A39 39 weeks gestation of pregnancy: Secondary | ICD-10-CM | POA: Diagnosis not present

## 2019-09-02 DIAGNOSIS — Z3490 Encounter for supervision of normal pregnancy, unspecified, unspecified trimester: Secondary | ICD-10-CM

## 2019-09-02 DIAGNOSIS — O26893 Other specified pregnancy related conditions, third trimester: Secondary | ICD-10-CM | POA: Diagnosis present

## 2019-09-02 DIAGNOSIS — Z20822 Contact with and (suspected) exposure to covid-19: Secondary | ICD-10-CM | POA: Insufficient documentation

## 2019-09-02 LAB — CBC
HCT: 36.9 % (ref 36.0–46.0)
Hemoglobin: 11.9 g/dL — ABNORMAL LOW (ref 12.0–15.0)
MCH: 28.3 pg (ref 26.0–34.0)
MCHC: 32.2 g/dL (ref 30.0–36.0)
MCV: 87.6 fL (ref 80.0–100.0)
Platelets: 282 10*3/uL (ref 150–400)
RBC: 4.21 MIL/uL (ref 3.87–5.11)
RDW: 14.7 % (ref 11.5–15.5)
WBC: 12.5 10*3/uL — ABNORMAL HIGH (ref 4.0–10.5)
nRBC: 0 % (ref 0.0–0.2)

## 2019-09-02 LAB — TYPE AND SCREEN
ABO/RH(D): O POS
Antibody Screen: NEGATIVE

## 2019-09-02 LAB — RPR: RPR Ser Ql: NONREACTIVE

## 2019-09-02 LAB — ABO/RH: ABO/RH(D): O POS

## 2019-09-02 MED ORDER — PHENYLEPHRINE 40 MCG/ML (10ML) SYRINGE FOR IV PUSH (FOR BLOOD PRESSURE SUPPORT)
80.0000 ug | PREFILLED_SYRINGE | INTRAVENOUS | Status: DC | PRN
  Filled 2019-09-02: qty 10

## 2019-09-02 MED ORDER — PHENYLEPHRINE 40 MCG/ML (10ML) SYRINGE FOR IV PUSH (FOR BLOOD PRESSURE SUPPORT): 80.0000 ug | PREFILLED_SYRINGE | INTRAVENOUS | Status: DC | PRN

## 2019-09-02 MED ORDER — LIDOCAINE HCL (PF) 1 % IJ SOLN: 30.0000 mL | INTRAMUSCULAR | Status: DC | PRN

## 2019-09-02 MED ORDER — ONDANSETRON HCL 4 MG PO TABS: 4.0000 mg | ORAL_TABLET | ORAL | Status: DC | PRN

## 2019-09-02 MED ORDER — IBUPROFEN 600 MG PO TABS
600.0000 mg | ORAL_TABLET | Freq: Four times a day (QID) | ORAL | Status: DC
  Administered 2019-09-02 – 2019-09-04 (×8): 600 mg via ORAL
  Filled 2019-09-02 (×8): qty 1

## 2019-09-02 MED ORDER — OXYTOCIN 40 UNITS IN NORMAL SALINE INFUSION - SIMPLE MED
1.0000 m[IU]/min | INTRAVENOUS | Status: DC
  Administered 2019-09-02: 11:00:00 2 m[IU]/min via INTRAVENOUS

## 2019-09-02 MED ORDER — SOD CITRATE-CITRIC ACID 500-334 MG/5ML PO SOLN: 30.0000 mL | ORAL | Status: DC | PRN

## 2019-09-02 MED ORDER — MISOPROSTOL 25 MCG QUARTER TABLET
25.0000 ug | ORAL_TABLET | ORAL | Status: DC | PRN
  Administered 2019-09-02 (×2): 25 ug via VAGINAL
  Filled 2019-09-02 (×2): qty 1

## 2019-09-02 MED ORDER — ACETAMINOPHEN 325 MG PO TABS
650.0000 mg | ORAL_TABLET | ORAL | Status: DC | PRN
  Administered 2019-09-03: 650 mg via ORAL
  Filled 2019-09-02: qty 2

## 2019-09-02 MED ORDER — ACETAMINOPHEN 325 MG PO TABS: 650.0000 mg | ORAL_TABLET | ORAL | Status: DC | PRN

## 2019-09-02 MED ORDER — TETANUS-DIPHTH-ACELL PERTUSSIS 5-2.5-18.5 LF-MCG/0.5 IM SUSP: 0.5000 mL | Freq: Once | INTRAMUSCULAR | Status: DC

## 2019-09-02 MED ORDER — SENNOSIDES-DOCUSATE SODIUM 8.6-50 MG PO TABS
2.0000 | ORAL_TABLET | ORAL | Status: DC
  Administered 2019-09-02: 2 via ORAL
  Filled 2019-09-02 (×2): qty 2

## 2019-09-02 MED ORDER — LACTATED RINGERS IV SOLN: 500.0000 mL | INTRAVENOUS | Status: DC | PRN

## 2019-09-02 MED ORDER — DIBUCAINE (PERIANAL) 1 % EX OINT: 1.0000 "application " | TOPICAL_OINTMENT | CUTANEOUS | Status: DC | PRN

## 2019-09-02 MED ORDER — DIPHENHYDRAMINE HCL 25 MG PO CAPS: 25.0000 mg | ORAL_CAPSULE | Freq: Four times a day (QID) | ORAL | Status: DC | PRN

## 2019-09-02 MED ORDER — ONDANSETRON HCL 4 MG/2ML IJ SOLN: 4.0000 mg | INTRAMUSCULAR | Status: DC | PRN

## 2019-09-02 MED ORDER — EPHEDRINE 5 MG/ML INJ: 10.0000 mg | INTRAVENOUS | Status: DC | PRN

## 2019-09-02 MED ORDER — BENZOCAINE-MENTHOL 20-0.5 % EX AERO
1.0000 "application " | INHALATION_SPRAY | CUTANEOUS | Status: DC | PRN
  Filled 2019-09-02: qty 56

## 2019-09-02 MED ORDER — DIPHENHYDRAMINE HCL 50 MG/ML IJ SOLN: 12.5000 mg | INTRAMUSCULAR | Status: DC | PRN

## 2019-09-02 MED ORDER — LACTATED RINGERS IV SOLN: INTRAVENOUS | Status: DC

## 2019-09-02 MED ORDER — LACTATED RINGERS IV SOLN
500.0000 mL | Freq: Once | INTRAVENOUS | Status: AC
  Administered 2019-09-02: 500 mL via INTRAVENOUS

## 2019-09-02 MED ORDER — OXYCODONE-ACETAMINOPHEN 5-325 MG PO TABS: 1.0000 | ORAL_TABLET | ORAL | Status: DC | PRN

## 2019-09-02 MED ORDER — PRENATAL MULTIVITAMIN CH
1.0000 | ORAL_TABLET | Freq: Every day | ORAL | Status: DC
  Administered 2019-09-03 – 2019-09-04 (×2): 1 via ORAL
  Filled 2019-09-02 (×2): qty 1

## 2019-09-02 MED ORDER — FENTANYL-BUPIVACAINE-NACL 0.5-0.125-0.9 MG/250ML-% EP SOLN
12.0000 mL/h | EPIDURAL | Status: DC | PRN
  Filled 2019-09-02: qty 250

## 2019-09-02 MED ORDER — ONDANSETRON HCL 4 MG/2ML IJ SOLN: 4.0000 mg | Freq: Four times a day (QID) | INTRAMUSCULAR | Status: DC | PRN

## 2019-09-02 MED ORDER — OXYCODONE-ACETAMINOPHEN 5-325 MG PO TABS: 2.0000 | ORAL_TABLET | ORAL | Status: DC | PRN

## 2019-09-02 MED ORDER — FENTANYL CITRATE (PF) 100 MCG/2ML IJ SOLN: 50.0000 ug | INTRAMUSCULAR | Status: DC | PRN

## 2019-09-02 MED ORDER — TERBUTALINE SULFATE 1 MG/ML IJ SOLN: 0.2500 mg | Freq: Once | INTRAMUSCULAR | Status: DC | PRN

## 2019-09-02 MED ORDER — FLEET ENEMA 7-19 GM/118ML RE ENEM: 1.0000 | ENEMA | RECTAL | Status: DC | PRN

## 2019-09-02 MED ORDER — SODIUM CHLORIDE (PF) 0.9 % IJ SOLN
INTRAMUSCULAR | Status: DC | PRN
  Administered 2019-09-02: 12 mL/h via EPIDURAL

## 2019-09-02 MED ORDER — LIDOCAINE HCL (PF) 1 % IJ SOLN
INTRAMUSCULAR | Status: DC | PRN
  Administered 2019-09-02 (×2): 4 mL via EPIDURAL

## 2019-09-02 MED ORDER — SIMETHICONE 80 MG PO CHEW
80.0000 mg | CHEWABLE_TABLET | ORAL | Status: DC | PRN
  Administered 2019-09-02 – 2019-09-03 (×2): 80 mg via ORAL
  Filled 2019-09-02 (×2): qty 1

## 2019-09-02 MED ORDER — COCONUT OIL OIL: 1.0000 "application " | TOPICAL_OIL | Status: DC | PRN

## 2019-09-02 MED ORDER — OXYCODONE HCL 5 MG PO TABS: 10.0000 mg | ORAL_TABLET | ORAL | Status: DC | PRN

## 2019-09-02 MED ORDER — ZOLPIDEM TARTRATE 5 MG PO TABS: 5.0000 mg | ORAL_TABLET | Freq: Every evening | ORAL | Status: DC | PRN

## 2019-09-02 MED ORDER — WITCH HAZEL-GLYCERIN EX PADS: 1.0000 "application " | MEDICATED_PAD | CUTANEOUS | Status: DC | PRN

## 2019-09-02 MED ORDER — OXYTOCIN BOLUS FROM INFUSION
500.0000 mL | Freq: Once | INTRAVENOUS | Status: AC
  Administered 2019-09-02: 15:00:00 500 mL via INTRAVENOUS

## 2019-09-02 MED ORDER — OXYTOCIN 40 UNITS IN NORMAL SALINE INFUSION - SIMPLE MED
2.5000 [IU]/h | INTRAVENOUS | Status: DC
  Administered 2019-09-02: 2.5 [IU]/h via INTRAVENOUS
  Filled 2019-09-02: qty 1000

## 2019-09-02 MED ORDER — OXYCODONE HCL 5 MG PO TABS: 5.0000 mg | ORAL_TABLET | ORAL | Status: DC | PRN

## 2019-09-02 NOTE — Anesthesia Preprocedure Evaluation (Signed)
Anesthesia Evaluation  Patient identified by MRN, date of birth, ID band Patient awake    Reviewed: Allergy & Precautions, Patient's Chart, lab work & pertinent test results  Airway Mallampati: II  TM Distance: >3 FB Neck ROM: Full    Dental no notable dental hx.    Pulmonary neg pulmonary ROS,    Pulmonary exam normal breath sounds clear to auscultation       Cardiovascular negative cardio ROS Normal cardiovascular exam Rhythm:Regular Rate:Normal     Neuro/Psych negative neurological ROS  negative psych ROS   GI/Hepatic negative GI ROS, Neg liver ROS,   Endo/Other  negative endocrine ROS  Renal/GU negative Renal ROS     Musculoskeletal negative musculoskeletal ROS (+)   Abdominal   Peds  Hematology negative hematology ROS (+)   Anesthesia Other Findings   Reproductive/Obstetrics (+) Pregnancy                             Anesthesia Physical Anesthesia Plan  ASA: II  Anesthesia Plan: Epidural   Post-op Pain Management:    Induction:   PONV Risk Score and Plan:   Airway Management Planned:   Additional Equipment:   Intra-op Plan:   Post-operative Plan:   Informed Consent: I have reviewed the patients History and Physical, chart, labs and discussed the procedure including the risks, benefits and alternatives for the proposed anesthesia with the patient or authorized representative who has indicated his/her understanding and acceptance.       Plan Discussed with:   Anesthesia Plan Comments:         Anesthesia Quick Evaluation  

## 2019-09-02 NOTE — Progress Notes (Signed)
No changes to H&P FHT cat one UCs q2-4 min Cytotec x 2 Cx 3/80/-2/vtx AROM clear Check cx in 1 hour Epidural prn

## 2019-09-02 NOTE — Progress Notes (Signed)
Delivery Note At 3:18 PM a viable female was delivered via Vaginal, Spontaneous (Presentation: Right Occiput Anterior).  APGAR: , ; weight  .   Placenta status: Spontaneous;Pathology, Intact.  Cord: 3 vessels with the following complications: None.  Cord pH: pending Terminal light mec Anesthesia: Epidural Episiotomy: None Lacerations: 2nd degree, midline, repaired Suture Repair: 2.0 vicryl rapide Est. Blood Loss (mL):    Mom to postpartum.  Baby to Couplet care / Skin to Skin.  Heidi Ayala II 09/02/2019, 3:33 PM

## 2019-09-02 NOTE — Anesthesia Procedure Notes (Signed)
Epidural Patient location during procedure: OB  Staffing Anesthesiologist: Lonia Roane, MD Performed: anesthesiologist   Preanesthetic Checklist Completed: patient identified, IV checked, risks and benefits discussed, monitors and equipment checked, pre-op evaluation and timeout performed  Epidural Patient position: sitting Prep: DuraPrep and site prepped and draped Patient monitoring: heart rate, continuous pulse ox and blood pressure Approach: midline Location: L3-L4 Injection technique: LOR air and LOR saline  Needle:  Needle type: Tuohy  Needle gauge: 17 G Needle length: 9 cm Needle insertion depth: 6 cm Catheter type: closed end flexible Catheter size: 19 Gauge Catheter at skin depth: 11 cm Test dose: negative  Assessment Sensory level: T8 Events: blood not aspirated, injection not painful, no injection resistance, no paresthesia and negative IV test  Additional Notes Reason for block:procedure for pain     

## 2019-09-03 LAB — CBC
HCT: 30.9 % — ABNORMAL LOW (ref 36.0–46.0)
Hemoglobin: 10 g/dL — ABNORMAL LOW (ref 12.0–15.0)
MCH: 28.3 pg (ref 26.0–34.0)
MCHC: 32.4 g/dL (ref 30.0–36.0)
MCV: 87.5 fL (ref 80.0–100.0)
Platelets: 208 10*3/uL (ref 150–400)
RBC: 3.53 MIL/uL — ABNORMAL LOW (ref 3.87–5.11)
RDW: 14.7 % (ref 11.5–15.5)
WBC: 12.6 10*3/uL — ABNORMAL HIGH (ref 4.0–10.5)
nRBC: 0 % (ref 0.0–0.2)

## 2019-09-03 MED ORDER — IBUPROFEN 600 MG PO TABS: 600.0000 mg | ORAL_TABLET | Freq: Four times a day (QID) | ORAL | 0 refills | Status: DC

## 2019-09-03 NOTE — Anesthesia Postprocedure Evaluation (Signed)
Anesthesia Post Note  Patient: IVAH GIRARDOT  Procedure(s) Performed: AN AD HOC LABOR EPIDURAL     Patient location during evaluation: Mother Baby Anesthesia Type: Epidural Level of consciousness: awake and alert Pain management: pain level controlled Vital Signs Assessment: post-procedure vital signs reviewed and stable Respiratory status: spontaneous breathing, nonlabored ventilation and respiratory function stable Cardiovascular status: stable Postop Assessment: no headache, no backache and epidural receding Anesthetic complications: no    Last Vitals:  Vitals:   09/03/19 0200 09/03/19 0534  BP: 123/84 105/77  Pulse: 67 66  Resp: 18 18  Temp: 36.7 C 36.6 C  SpO2: 100% 100%    Last Pain:  Vitals:   09/03/19 0535  TempSrc:   PainSc: 0-No pain   Pain Goal: Patients Stated Pain Goal: 4 (09/02/19 1045)                 [place] Area

## 2019-09-03 NOTE — Progress Notes (Signed)
Post Partum Day 1 Subjective: no complaints, up ad lib, voiding and tolerating PO  Objective: Blood pressure 105/77, pulse 66, temperature 97.9 F (36.6 C), temperature source Oral, resp. rate 18, height 5\' 5"  (1.651 m), weight 78.5 kg, SpO2 100 %, unknown if currently breastfeeding.  Physical Exam:  General: alert, cooperative and appears stated age Lochia: appropriate Uterine Fundus: firm Incision: healing well, no significant drainage, no dehiscence DVT Evaluation: No evidence of DVT seen on physical exam. Negative Homan's sign. No cords or calf tenderness.  Recent Labs    09/02/19 0022 09/03/19 0523  HGB 11.9* 10.0*  HCT 36.9 30.9*    Assessment/Plan: Discharge home and Breastfeeding   LOS: 1 day   09/05/19 09/03/2019, 9:21 AM

## 2019-09-03 NOTE — Progress Notes (Signed)
CSW received consult for hx of Anxiety and Depression.  CSW met with MOB to offer support and complete assessment.    CSW congratulated MOB on the birth of infant. CSW advised MOB of the HIPPA policy as CSW observed that MOB had guess in the room. MOB reported to CSW that it was fine for guest to remain in the room while CSW spoke with her. MOB was then advised of CSW's role and the reason for CSW coming to visit with her . MOB reported that she was diagnosed with anxiety/depression in 2016-2017. MOB reports that she was placed on Effexor for 6 months and then stopped the medication as it was no longer needs. MOB reported that since taking Effexor in the past she has felt fine and reported no other symptoms of depression or anxiety. MOB denies SI and HI and reported no desire for therapy resources at this time.   MOB expressed that she has all needed items to care for infant and expressed that her family is her support at this time.   CSW provided education regarding the baby blues period vs. perinatal mood disorders, discussed treatment and gave resources for mental health follow up if concerns arise.  CSW recommends self-evaluation during the postpartum time period using the New Mom Checklist from Postpartum Progress and encouraged MOB to contact a medical professional if symptoms are noted at any time.   CSW provided review of Sudden Infant Death Syndrome (SIDS) precautions.   CSW identifies no further need for intervention and no barriers to discharge at this time.    Olis Viverette S. Belia Febo, MSW, LCSW [facility] [phone]   

## 2019-09-03 NOTE — Discharge Summary (Signed)
Obstetric Discharge Summary Reason for Admission: induction of labor Prenatal Procedures: none Intrapartum Procedures: spontaneous vaginal delivery Postpartum Procedures: none Complications-Operative and Postpartum: 2nd degree perineal laceration Hemoglobin  Date Value Ref Range Status  09/03/2019 10.0 (L) 12.0 - 15.0 g/dL Final   HCT  Date Value Ref Range Status  09/03/2019 30.9 (L) 36.0 - 46.0 % Final    Physical Exam:  General: alert, cooperative and appears stated age 29: appropriate Uterine Fundus: firm Incision: healing well, no significant drainage, no dehiscence DVT Evaluation: No evidence of DVT seen on physical exam. Negative Homan's sign. No cords or calf tenderness.  Discharge Diagnoses: Term Pregnancy-delivered  Discharge Information: Date: 09/03/2019 Activity: pelvic rest Diet: routine Medications: PNV and Ibuprofen Condition: stable Instructions: refer to practice specific booklet Discharge to: home   Newborn Data: Live born female  Birth Weight: 7 lb 13.4 oz (3555 g) APGAR: 8, 9  Newborn Delivery   Birth date/time: 09/02/2019 15:18:00 Delivery type: Vaginal, Spontaneous      Home with mother.  Mitchel Honour 09/03/2019, 9:23 AM

## 2019-09-03 NOTE — Discharge Instructions (Signed)
Call MD for T>100.4, heavy vaginal bleeding, severe abdominal pain, or respiratory distress.  Call office to schedule postpartum visit.  Pelvic rest x 6 weeks.   °

## 2019-09-04 LAB — SURGICAL PATHOLOGY

## 2019-09-04 NOTE — Progress Notes (Signed)
Dr. Langston Masker notified that newborn has still yet to stool and order to cancel discharge obtained. Parents in agreeance with staying another night.   Elvia Collum, RN 09/04/19

## 2019-09-04 NOTE — Discharge Summary (Signed)
Obstetric Discharge Summary Reason for Admission: induction of labor Prenatal Procedures: none Intrapartum Procedures: spontaneous vaginal delivery Postpartum Procedures: none Complications-Operative and Postpartum: 2nd degree perineal laceration Hemoglobin  Date Value Ref Range Status  09/03/2019 10.0 (L) 12.0 - 15.0 g/dL Final   HCT  Date Value Ref Range Status  09/03/2019 30.9 (L) 36.0 - 46.0 % Final      Physical Exam:  General: alert, cooperative and appears stated age 29: appropriate Uterine Fundus: firm Incision: healing well, no significant drainage, no dehiscence DVT Evaluation: No evidence of DVT seen on physical exam. Negative Homan's sign. No cords or calf tenderness.  Discharge Diagnoses: Term Pregnancy-delivered  Discharge Information: Date: 09/03/2019 Activity: pelvic rest Diet: routine Medications: PNV and Ibuprofen Condition: stable Instructions: refer to practice specific booklet Discharge to: home  Newborn Data: Live born female  Birth Weight: 7 lb 13.4 oz (3555 g) APGAR: 8, 9  Newborn Delivery   Birth date/time: 09/[birth year]/2019 15:18:00 Delivery type: Vaginal, Spontaneous

## 2019-09-06 ENCOUNTER — Inpatient Hospital Stay (HOSPITAL_COMMUNITY)
Admission: AD | Admit: 2019-09-06 | Discharge: 2019-09-06 | Disposition: A | Discharge status: Home / Self Care | Payer: BC Managed Care – PPO | Attending: Obstetrics and Gynecology | Admitting: Obstetrics and Gynecology

## 2019-09-06 ENCOUNTER — Other Ambulatory Visit: Payer: Self-pay

## 2019-09-06 ENCOUNTER — Encounter (HOSPITAL_COMMUNITY): Payer: Self-pay | Admitting: Obstetrics and Gynecology

## 2019-09-06 DIAGNOSIS — Z8759 Personal history of other complications of pregnancy, childbirth and the puerperium: Secondary | ICD-10-CM | POA: Diagnosis not present

## 2019-09-06 DIAGNOSIS — R102 Pelvic and perineal pain: Secondary | ICD-10-CM | POA: Insufficient documentation

## 2019-09-06 DIAGNOSIS — O8620 Urinary tract infection following delivery, unspecified: Secondary | ICD-10-CM

## 2019-09-06 DIAGNOSIS — R300 Dysuria: Secondary | ICD-10-CM

## 2019-09-06 DIAGNOSIS — Z3A01 Less than 8 weeks gestation of pregnancy: Secondary | ICD-10-CM | POA: Insufficient documentation

## 2019-09-06 DIAGNOSIS — R309 Painful micturition, unspecified: Secondary | ICD-10-CM | POA: Diagnosis not present

## 2019-09-06 LAB — URINALYSIS, ROUTINE W REFLEX MICROSCOPIC
Bacteria, UA: NONE SEEN
Bilirubin Urine: NEGATIVE
Glucose, UA: NEGATIVE mg/dL
Hgb urine dipstick: NEGATIVE
Ketones, ur: NEGATIVE mg/dL
Nitrite: NEGATIVE
Protein, ur: NEGATIVE mg/dL
Specific Gravity, Urine: 1.014 (ref 1.005–1.030)
pH: 6 (ref 5.0–8.0)

## 2019-09-06 MED ORDER — BENZOCAINE-MENTHOL 20-0.5 % EX AERO
1.0000 "application " | INHALATION_SPRAY | Freq: Four times a day (QID) | CUTANEOUS | Status: DC | PRN
  Administered 2019-09-06: 1 via TOPICAL
  Filled 2019-09-06: qty 56

## 2019-09-06 NOTE — Discharge Instructions (Signed)
Care of a Perineal Tear A perineal tear is a cut or tear (laceration) in the tissue between the opening of the vagina and the anus (perineum). Some women develop a perineal tear during a vaginal birth. This can happen as the baby emerges from the birth canal and the perineum is stretched. There are four degrees of perineal tears based on how deep and long the laceration is:  First degree. This involves a shallow tear at the edge of the vaginal opening that extends slightly into the perineal skin.  Second degree. This involves tearing described in first degree perineal tear, and an additional deeper tear of the vaginal opening and perineal tissues. It may also include tearing of a muscle just under the perineal skin.  Third degree. This involves tearing described in first and second degree perineal tears, with the addition that tearing in the third degree extends into the muscle of the anus (anal sphincter).  Fourth degree. This involves all levels of tears described in first, second, and third degree perineal tears, with the tear in the fourth degree extending into the rectum. First and second degree perineal tears may or may not be stitched closed, depending on their location and appearance. Third and fourth degree perineal tears are stitched closed immediately after the baby's birth. What are the risks? Depending on the type of perineal tear you have, you may be at risk for:  Bleeding.  Developing a collection of blood in the perineal tear area (hematoma).  Pain. This may include pain when you urinate, or pain when you have a bowel movement.  Infection at the site of the tear.  Fever.  Trouble controlling your urination or bowels (incontinence).  Painful sex. How to care for a perineal tear Wound care  Take a sitz bath as told by your health care provider. A sitz bath is a warm water bath that is taken while you are sitting down. The water should only come up to your hips and should  cover your buttocks. This can speed up healing. 1. Partially fill a bathtub with warm water. You will only need the water to be deep enough to cover your hips and buttocks when you are sitting in it. 2. If your health care provider told you to put medicine in the water, follow the directions exactly as told. 3. Sit in the water and open the tub drain a little. 4. Turn on the warm water again to keep the tub at the correct level. Keep the water running constantly. 5. Soak in the water for 15-20 minutes or as told by your health care provider. 6. After the sitz bath, pat the affected area dry first. Do not rub it. 7. Be careful when you stand up after the sitz bath because you may feel dizzy.  Wash your hands before and after applying medicine to the area.  Wear a sanitary pad as told by your health care provider. Change the pad as often as told by your health care provider.  Leave stitches (sutures), skin glue, or adhesive strips in place. These skin closures may need to stay in place for 2 weeks or longer. If adhesive strip edges start to loosen and curl up, you may trim the loose edges. Do not remove adhesive strips completely unless your health care provider tells you to do that.  Check your wound every day for signs of infection. Check for: ? Redness, swelling, or pain. ? Fluid or blood. ? Warmth. ? Pus or a bad  smell. Managing pain  If directed, put ice on the painful area: ? Put ice in a plastic bag. ? Place a towel between your skin and the bag. ? Leave the ice on for 20 minutes, 2-3 times a day.  Apply a numbing spray to the perineal tear site as told by your health care provider. This may help with discomfort.  Take and apply over-the-counter and prescription medicines only as told by your health care provider.  If told, put about 3 witch hazel-containing hemorrhoid treatment pads on top of your sanitary pad. The witch hazel in the hemorrhoid pads helps with swelling and  discomfort.  Sit on an inflatable ring or pillow. This may provide comfort. General instructions  Squeeze warm water on your perineum after urinating. This should be done from front to back with a squeeze bottle. Pat the area to dry it.  Do not have sex, use tampons, or place anything in your vagina for at least 6 weeks or as told by your health care provider.  Keep all follow-up visits as told by your health care provider. These include any postpartum visits. This is important. Contact a health care provider if:  Your pain is not relieved with medicines.  You have painful urination.  You have redness, swelling, or pain around your tear.  You have fluid or blood coming from your tear.  Your tear feels warm to the touch.  You have pus or a bad smell coming from your tear.  You have a fever. Get help right away if:  Your tear opens.  You cannot urinate.  You have an increase in bleeding.  You have severe pain. Summary  A perineal tear is a cut or tear (laceration) in the tissue between the opening of the vagina and the anus (perineum).  There are four degrees of perineal tears based on how deep and long the laceration is.  First and second-degree perineal tears may or may not be stitched closed, depending on their location and appearance. Third and fourth- degree perineal tears are stitched closed immediately after the baby's birth.  Follow your health care provider's instructions for caring for your perineal tear. Know how to manage pain and how to care for your wound. Know when to call your health care provider and when to seek immediate emergency care. This information is not intended to replace advice given to you by your health care provider. Make sure you discuss any questions you have with your health care provider. Document Revised: 04/05/2017 Document Reviewed: 05/28/2016 Elsevier Patient Education  2020 Else[employer] Inc[employer]   Breast Engorgement Breast engorgement is  the overfilling of your breasts with breast milk. It is usually caused by delaying feedings, which can cause milk to build up. Breast engorgement can happen at any time while you are breast feeding, and is normal in the first 3-5 days after giving birth. The condition can make your breasts feel heavy, full, hard, tightly stretched, warm, and tender. Breast engorgement should improve within 24-48 hours of feeding your baby or expressing your milk. Follow these instructions at home: When to breastfeed or pump  Breastfeed when your baby shows signs of hunger. This is called "breastfeeding on demand."  Breastfeed or use a breast pump to remove milk from your breasts when you feel the need to reduce the fullness of your breasts.  If your baby is younger than 1 month, make sure you are breastfeeding every 1-3 hours during the day. You may need to wake up  your baby to feed if he or she is asleep at a feeding time.  Do not allow your baby to sleep longer than 5 hours during the night without a feeding.  Do not delay feedings.  If you are returning to work or are away from home for an extended period, try to pump your milk on the same schedule as when your baby would breastfeed. Before breastfeeding or pumping:  Increase the circulation in your breasts and help your milk flow. Try either of these methods: ? Taking a warm shower. ? Applying warm, water-soaked hand towels to your breasts. ? Massaging your breasts.  Pump or hand-express breast milk before breastfeeding to soften your breast, areola, and nipple. During breastfeeding or pumping:  Try to relax when it is time to feed your baby. This helps to trigger your "let-down reflex," which releases milk from your breast.  Ensure your baby is latched on to your breast and positioned properly while breastfeeding.  Empty your breasts completely when breastfeeding or pumping.  Allow your baby to remain at your breast as long as he or she is  latched on well and sucking. Your baby will let you know when he or she is done breastfeeding by pulling away from your breast or falling asleep.  Massage your breasts to help your milk flow. Managing pain and swelling   Take over-the-counter and prescription medicines only as told by your health care provider.  If directed, put ice on your breasts: ? Put ice in a plastic bag. ? Place a towel between your skin and the bag. ? Leave the ice on for 20 minutes, 2-3 times a day.  If you feel pain while breastfeeding, take your baby off your breast and try again. General instructions  After breastfeeding or pumping wear a snug bra or tank top for 1-2 days. This will signal your body to slightly decrease how much milk it makes. Once the engorgement passes, make sure you to wear a well-fitted, supportive bra and regular clothes.  Drink enough fluid to keep your urine clear or pale yellow.  Avoid introducing bottles or pacifiers to your baby in the early weeks of breastfeeding. Wait to introduce these things until after resolving any breastfeeding challenges. Contact a health care provider if:  Engorgement lasts longer than 2 days, even after treatment.  You have flu-like symptoms, such as a fever, chills, or body aches.  You have nausea or you vomit.  Your breasts become red and painful.  You have a lump in your breast.  Your nipples continue to crack or start to ooze.  There is yellow discharge coming from a nipple.  You have pain while breastfeeding, and it does not go away once you take your baby off your breast and try again. Get help right away if:  There is pus or blood in your breast milk.  You have sudden, severe symptoms.  You have red streaks near your breast.  Both breasts appear infected and you cannot breastfeed. Summary  Breast engorgement is the overfilling of your breasts with breast milk. It is usually caused by delayed feeding.  Although it is normal to  experience breast engorgement 3-5 days after giving birth, it can happen at any time while breastfeeding.  Do not delay feedings. Breastfeed on demand to help prevent engorgement.  Increase the circulation in your breasts and help your milk flow before feeding your baby. You can do this by taking a warm shower, applying warm water-soaked hand towels,  or massaging your breasts. This information is not intended to replace advice given to you by your health care provider. Make sure you discuss any questions you have with your health care provider. Document Revised: 04/05/2017 Document Reviewed: 05/28/2016 Elsevier Patient Education  2020 [employer].

## 2019-09-06 NOTE — MAU Note (Addendum)
Pt is s/p SVD on 4/28 with d/c on 4/30, 2 deg. tear with repair, no other complications.  Pt is not reporting increased pain when urinating and increased smell that started yesterday.  Pt spoke to MD office yesterday and was given antibiotics for a possible UTI over the phone, started yesterday.  No other symptoms reported.  Pt is concerned for infection.

## 2019-09-06 NOTE — MAU Provider Note (Signed)
Chief Complaint: vaginal pain   First Provider Initiated Contact with Patient 09/06/19 1538     SUBJECTIVE HPI: Heidi Ayala is a 29 y.o. G3P2012 at 4 days status post spontaneous vaginal delivery with a secondary perineal laceration who presents to Maternity Admissions concern for possible infection due to burning with urination and vaginal odor.  Called physicians for women yesterday over these concerns and was prescribed Macrobid for possible UTI.  No UA or culture performed.  Started antibiotics as instructed last night.  Burning with urination occurs where obstetric laceration occurred and is worse when urine runs over perineum.  Stopped using peribottle and Dermoplast a few days ago.  Location: Perineum Quality: Burning Severity: Moderate Duration: Few days Context: 4 days postpartum Timing: Intermittent, only with urination Modifying factors: None.  Has not tried anything for symptoms. Associated signs and symptoms: Negative for fever, chills, urgency, frequency, abdominal pain, flank pain.  Past Medical History:  Diagnosis Date  . Medical history non-contributory    OB History  Gravida Para Term Preterm AB Living  3 2 2   1 2   SAB TAB Ectopic Multiple Live Births  1     0 1    # Outcome Date GA Lbr Len/2nd Weight Sex Delivery Anes PTL Lv  3 Term 09/02/19 [redacted]w[redacted]d 05:12 / 00:30 3555 g F Vag-Spont EPI  LIV     Birth Comments: WNL  2 Term 03/2013 [redacted]w[redacted]d    Vag-Spont     1 SAB            Past Surgical History:  Procedure Laterality Date  . NO PAST SURGERIES    . URETHRAL CYST REMOVAL     Social History   Socioeconomic History  . Marital status: Married    Spouse name: Not on file  . Number of children: Not on file  . Years of education: Not on file  . Highest education level: Not on file  Occupational History  . Not on file  Tobacco Use  . Smoking status: Never Smoker  . Smokeless tobacco: Never Used  Substance and Sexual Activity  . Alcohol use: No  . Drug  use: No  . Sexual activity: Yes  Other Topics Concern  . Not on file  Social History Narrative  . Not on file   Social Determinants of Health   Financial Resource Strain:   . Difficulty of Paying Living Expenses:   Food Insecurity:   . Worried About [redacted]w[redacted]d in the Last Year:   . Programme researcher, broadcasting/film/video in the Last Year:   Transportation Needs:   . Barista (Medical):   Freight forwarder Lack of Transportation (Non-Medical):   Physical Activity:   . Days of Exercise per Week:   . Minutes of Exercise per Session:   Stress:   . Feeling of Stress :   Social Connections:   . Frequency of Communication with Friends and Family:   . Frequency of Social Gatherings with Friends and Family:   . Attends Religious Services:   . Active Member of Clubs or Organizations:   . Attends [employer] Meetings:   Banker Marital Status:   Intimate Partner Violence:   . Fear of Current or Ex-Partner:   . Emotionally Abused:   [employer] Physically Abused:   . Sexually Abused:    Family History  Problem Relation Age of Onset  . Cancer Paternal Aunt   . Hypertension Maternal Grandmother   . Cancer Paternal Grandfather  No current facility-administered medications on file prior to encounter.   Current Outpatient Medications on File Prior to Encounter  Medication Sig Dispense Refill  . ibuprofen (ADVIL) 600 MG tablet Take 1 tablet (600 mg total) by mouth every 6 (six) hours. 30 tablet 0  . loratadine (CLARITIN) 10 MG tablet Take 10 mg by mouth daily as needed for allergies.    . Prenatal Vit-Fe Fumarate-FA (PRENATAL MULTIVITAMIN) TABS tablet Take 1 tablet by mouth daily.      No Known Allergies  I have reviewed patient's Past Medical Hx, Surgical Hx, Family Hx, Social Hx, medications and allergies.   Review of Systems  Constitutional: Negative for chills and fever.  Gastrointestinal: Negative for abdominal pain, nausea and vomiting.  Genitourinary: Positive for dysuria and vaginal bleeding  (Small amount of lochia). Negative for difficulty urinating, flank pain, frequency, hematuria, pelvic pain, urgency, vaginal discharge and vaginal pain.  Musculoskeletal: Negative for back pain.    OBJECTIVE Patient Vitals for the past 24 hrs:  BP Temp Temp src Pulse  09/06/19 1512 127/80 98.3 F (36.8 C) Oral 84   Constitutional: Well-developed, well-nourished female in no acute distress.  Cardiovascular: normal rate Respiratory: normal rate and effort.  GI: Abd soft, non-tender. MS: Extremities nontender, no edema, normal ROM Neurologic: Alert and oriented x 4.  GU: Neg CVAT.  PELVIC EXAM: NEFG, small amount of lochia rubra, normal postpartum odor.  Second-degree perineal laceration well approximated, sutures in place, no bleeding, drainage, erythema.  Small, superficial labial lacerations.  BIMANUAL: Deferred  LAB RESULTS Results for orders placed or performed during the hospital encounter of 09/06/19 (from the past 24 hour(s))  Urinalysis, Routine w reflex microscopic     Status: Abnormal   Collection Time: 09/06/19  4:27 PM  Result Value Ref Range   Color, Urine YELLOW YELLOW   APPearance CLEAR CLEAR   Specific Gravity, Urine 1.014 1.005 - 1.030   pH 6.0 5.0 - 8.0   Glucose, UA NEGATIVE NEGATIVE mg/dL   Hgb urine dipstick NEGATIVE NEGATIVE   Bilirubin Urine NEGATIVE NEGATIVE   Ketones, ur NEGATIVE NEGATIVE mg/dL   Protein, ur NEGATIVE NEGATIVE mg/dL   Nitrite NEGATIVE NEGATIVE   Leukocytes,Ua SMALL (A) NEGATIVE   RBC / HPF 0-5 0 - 5 RBC/hpf   WBC, UA 0-5 0 - 5 WBC/hpf   Bacteria, UA NONE SEEN NONE SEEN   Squamous Epithelial / LPF 0-5 0 - 5   Mucus PRESENT     IMAGING No results found.  MAU COURSE Orders Placed This Encounter  Procedures  . Urine Culture  . Urinalysis, Routine w reflex microscopic  . Discharge patient   Meds ordered this encounter  Medications  . benzocaine-Menthol (DERMOPLAST) 20-0.5 % topical spray 1 application    MDM - Burning with  urination likely normal discomfort from obstetric lacerations.  Increased pain coincides with discontinuing Dermoplast and using a spray bottle.  Recommend that patient resume using both and use sitz bath as needed..  - Possible UTI.  Recommended continuing full (3-day) course of Bactrim.  Urine culture pending.  Will treat accordingly.  ASSESSMENT 1. Burning with urination   2. Postpartum state   3. Obstetric vaginal laceration with second degree perineal laceration     PLAN Discharge home in stable condition. UTI/Pyelo precautions Increase fluids and rest Discussed pericare and comfort measures Follow-up Information    Everlene Farrier, MD Follow up.   Specialty: Obstetrics and Gynecology Why: As scheduled or sooner as needed if symptoms worsen Contact  information: [address] [place] [address] [phone]        [facility] Follow up.   Specialty: Obstetrics and Gynecology Why: As needed in pregnancy-related emergencies. Contact information: [address] [id number] mc [place] [address] [phone]         Allergies as of 09/06/2019   No Known Allergies     Medication List    TAKE these medications   ibuprofen 600 MG tablet Commonly known as: ADVIL Take 1 tablet (600 mg total) by mouth every 6 (six) hours.   loratadine 10 MG tablet Commonly known as: CLARITIN Take 10 mg by mouth daily as needed for allergies.   prenatal multivitamin Tabs tablet Take 1 tablet by mouth daily.        Katrinka Blazing, [place], [place] 09/06/2019  4:34 PM

## 2019-09-07 LAB — URINE CULTURE
Culture: NO GROWTH
Special Requests: NORMAL

## 2020-05-13 IMAGING — US US OB < 14 WEEKS - US OB TV
1 series · 15 of 28 positions shown · non-contrast
Comparison: None.

CLINICAL DATA: Pelvic pain and cramping affecting pregnancy.
Vaginal bleeding in 1st trimester pregnancy.

EXAM:
OBSTETRIC <14 WK US AND TRANSVAGINAL OB US
TECHNIQUE: Both transabdominal and transvaginal ultrasound examinations were
performed for complete evaluation of the gestation as well as the
maternal uterus, adnexal regions, and pelvic cul-de-sac.
Transvaginal technique was performed to assess early pregnancy.

[Series 1: us ob < 14 weeks - us ob tv · 15 of 50 slices shown]
[im 1/50]
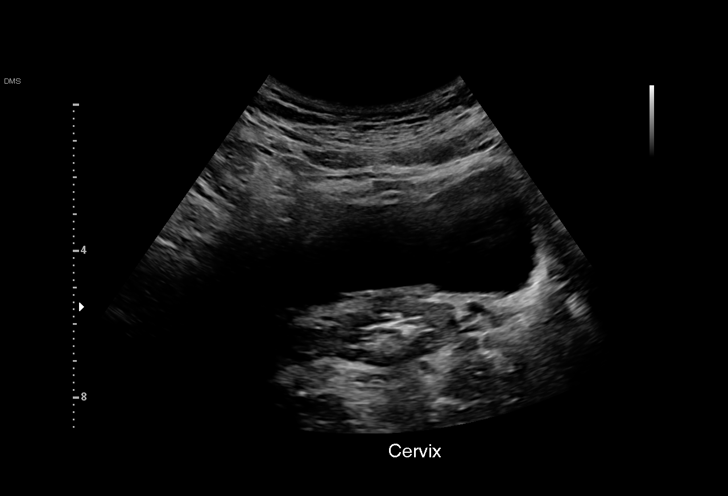
[im 4/50]
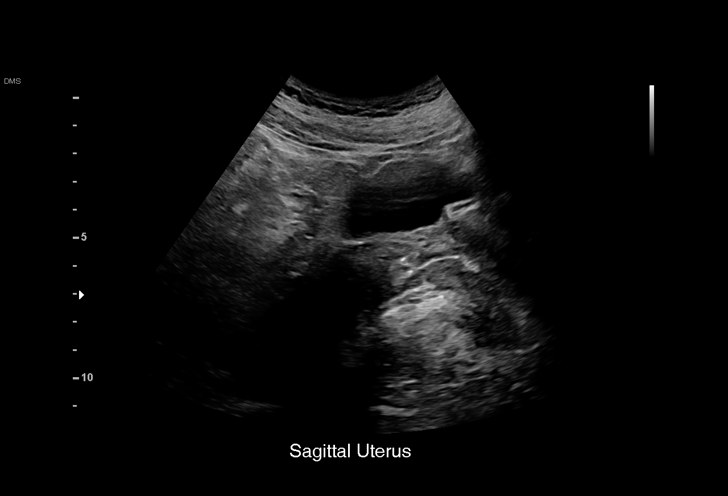
[im 8/50]
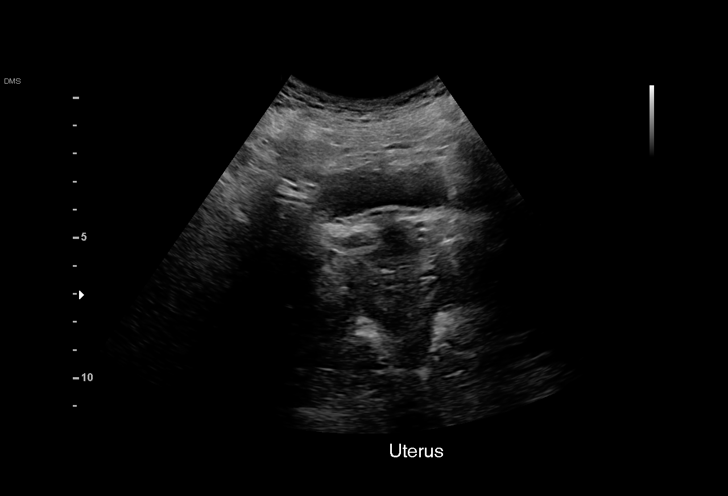
[im 11/50]
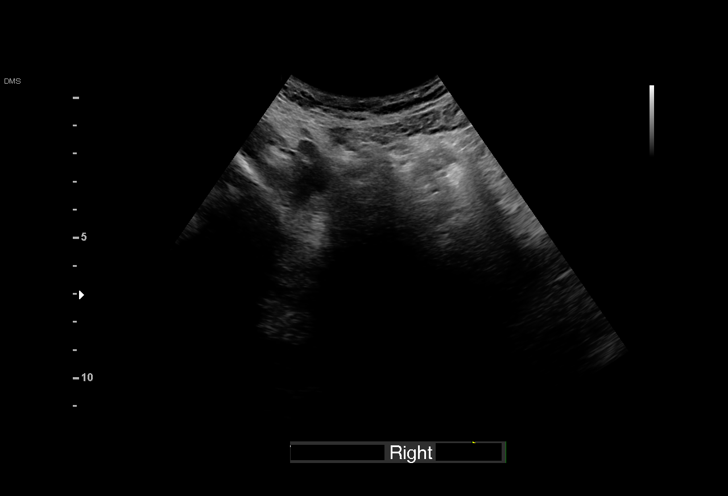
[im 15/50]
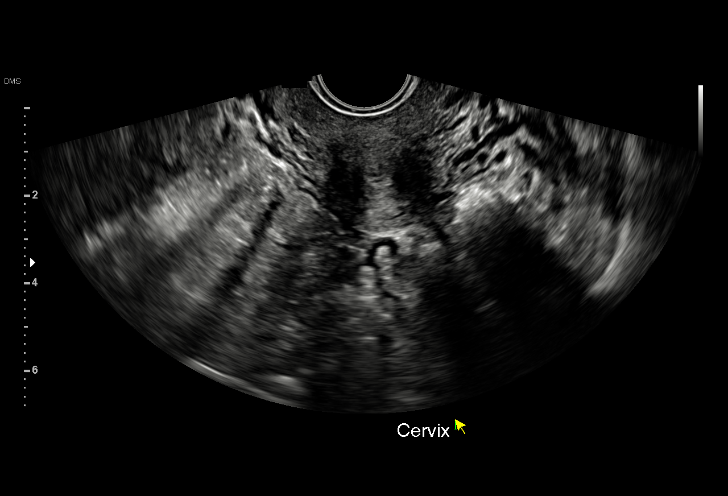
[im 19/50]
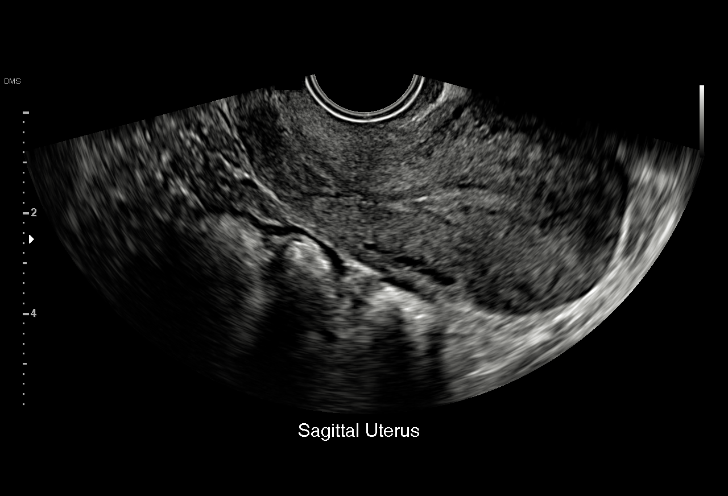
[im 22/50]
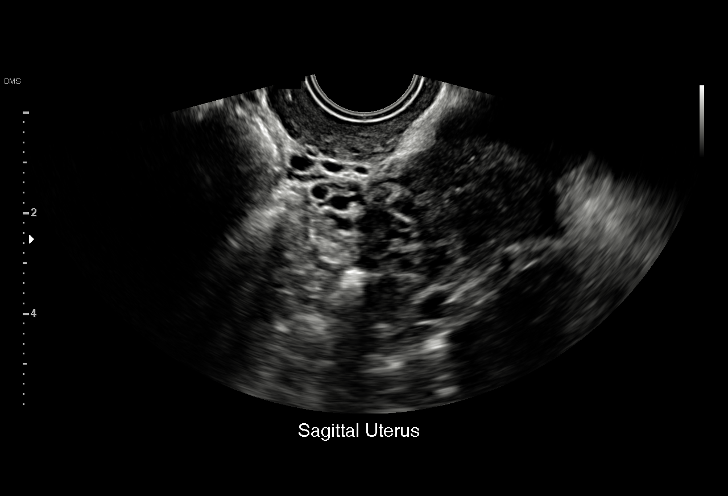
[im 26/50]
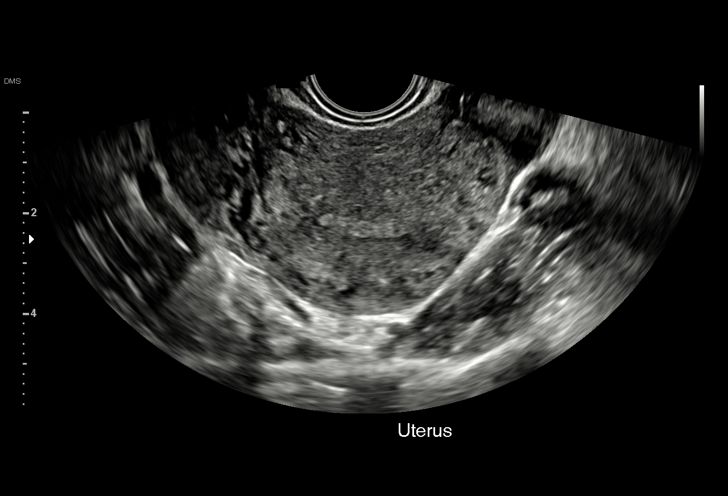
[im 28/50]
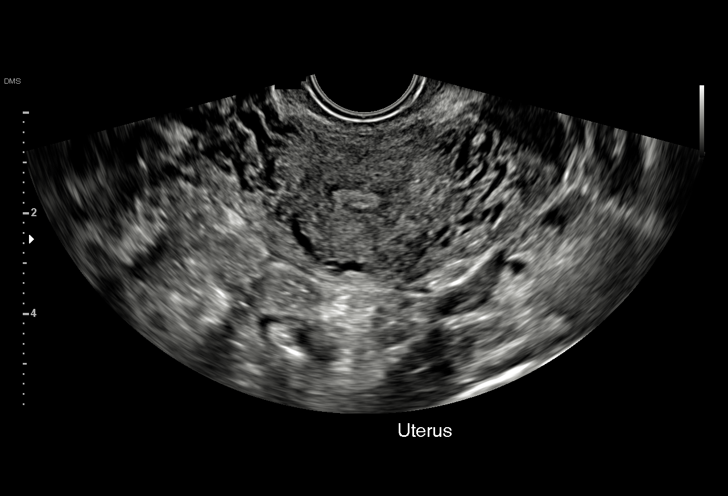
[im 31/50]
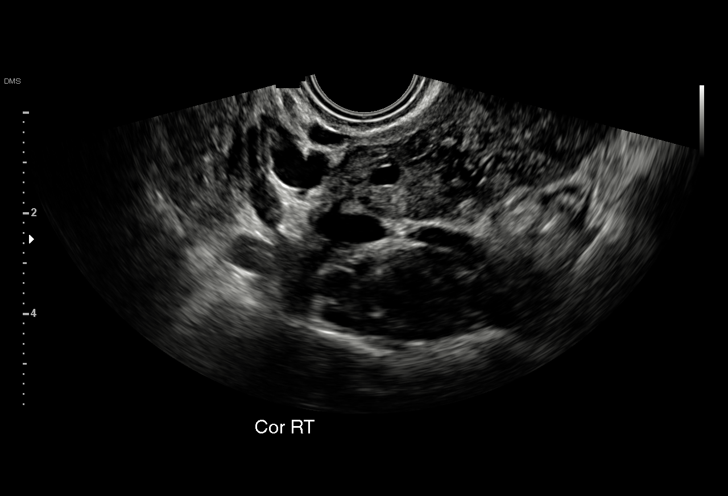
[im 35/50]
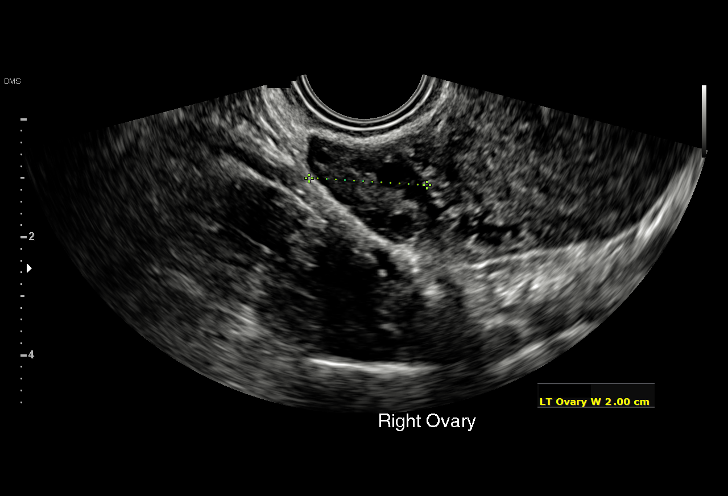
[im 39/50]
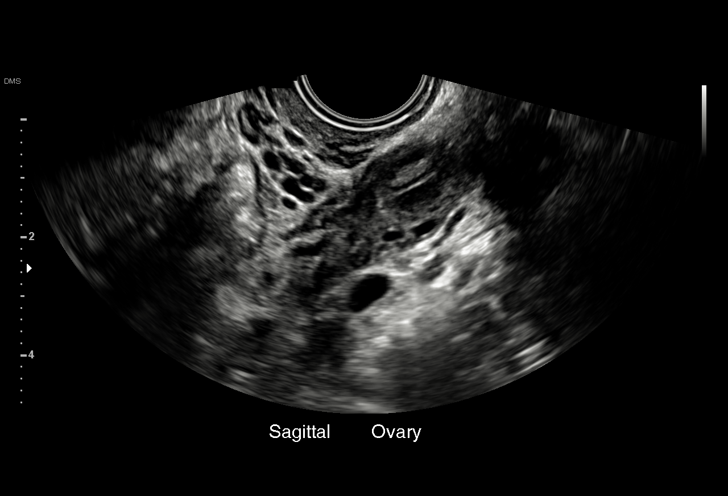
[im 42/50]
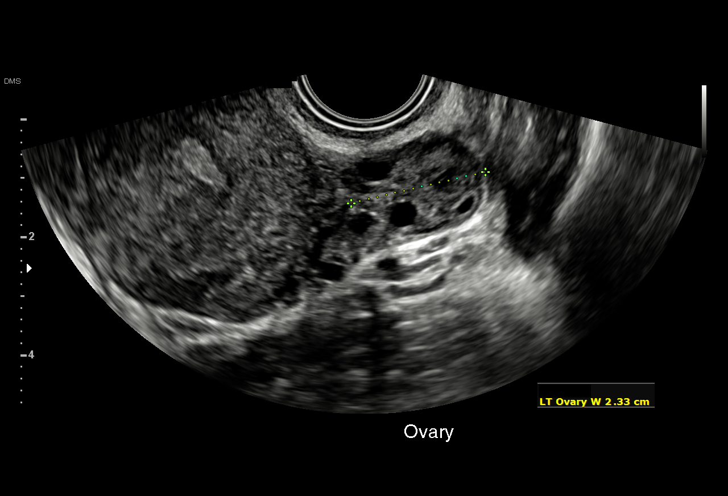
[im 46/50]
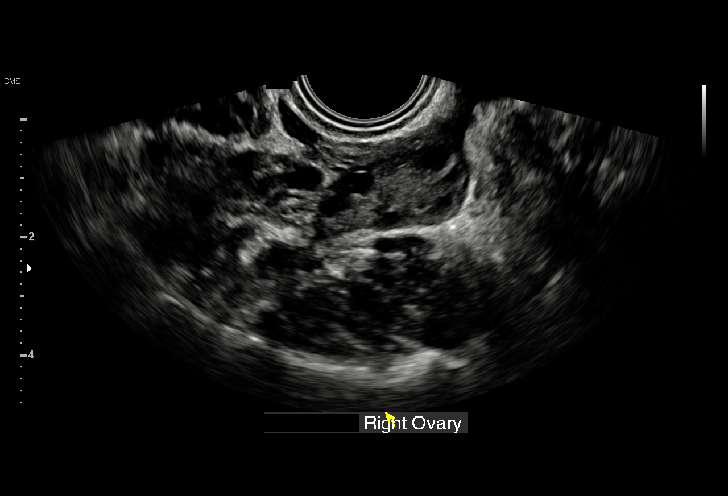
[im 50/50]
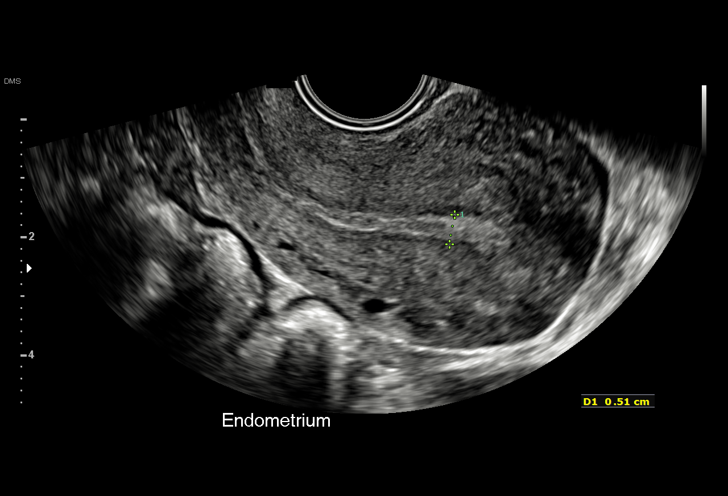

[15 of 28 positions shown; findings below may reference images not displayed]

FINDINGS: Intrauterine gestational sac: None

Maternal uterus/adnexae: Uterus is retroverted. Endometrial
thickness measures 5 mm. Both ovaries are normal in appearance. No
mass or abnormal free fluid identified.
IMPRESSION: Pregnancy of unknown anatomic location (no intrauterine gestational
sac or adnexal mass identified). Differential diagnosis includes
recent spontaneous abortion, IUP too early to visualize, and
non-visualized ectopic pregnancy. Recommend correlation with serial
beta-hCG levels, and follow up US if warranted clinically.

## 2022-05-07 NOTE — L&D Delivery Note (Signed)
Delivery Note  SVD viable female Apgars 8,9 over 2nd degree ML laceration.  Placenta delivered spontaneously intact with 3VC. Repair with 2-0 with good support and hemostasis noted.  R/V exam confirms. .   Mother and baby to couplet care and are doing well.  EBL 137 cc  Candice Camp, MD

## 2022-10-15 ENCOUNTER — Other Ambulatory Visit: Payer: Self-pay

## 2022-10-15 ENCOUNTER — Ambulatory Visit (HOSPITAL_COMMUNITY)
Admission: AD | Admit: 2022-10-15 | Discharge: 2022-10-15 | Disposition: A | Discharge status: Home / Self Care | Payer: Commercial Managed Care - PPO | Attending: Emergency Medicine | Admitting: Emergency Medicine

## 2022-10-15 ENCOUNTER — Ambulatory Visit (HOSPITAL_COMMUNITY): Payer: Commercial Managed Care - PPO

## 2022-10-15 ENCOUNTER — Encounter (HOSPITAL_COMMUNITY): Payer: Self-pay

## 2022-10-15 DIAGNOSIS — R0789 Other chest pain: Secondary | ICD-10-CM

## 2022-10-15 DIAGNOSIS — Z3A22 22 weeks gestation of pregnancy: Secondary | ICD-10-CM | POA: Insufficient documentation

## 2022-10-15 DIAGNOSIS — O26892 Other specified pregnancy related conditions, second trimester: Secondary | ICD-10-CM | POA: Diagnosis not present

## 2022-10-15 LAB — CBC
HCT: 33.4 % — ABNORMAL LOW (ref 36.0–46.0)
Hemoglobin: 11.2 g/dL — ABNORMAL LOW (ref 12.0–15.0)
MCH: 31.1 pg (ref 26.0–34.0)
MCHC: 33.5 g/dL (ref 30.0–36.0)
MCV: 92.8 fL (ref 80.0–100.0)
Platelets: 234 10*3/uL (ref 150–400)
RBC: 3.6 MIL/uL — ABNORMAL LOW (ref 3.87–5.11)
RDW: 13.4 % (ref 11.5–15.5)
WBC: 9.3 10*3/uL (ref 4.0–10.5)
nRBC: 0 % (ref 0.0–0.2)

## 2022-10-15 LAB — BASIC METABOLIC PANEL
Anion gap: 9 (ref 5–15)
BUN: <5 mg/dL — ABNORMAL LOW (ref 6–20)
CO2: 21 mmol/L — ABNORMAL LOW (ref 22–32)
Calcium: 8.3 mg/dL — ABNORMAL LOW (ref 8.9–10.3)
Chloride: 105 mmol/L (ref 98–111)
Creatinine, Ser: 0.51 mg/dL (ref 0.44–1.00)
GFR, Estimated: >60 mL/min (ref >60)
Glucose, Bld: 100 mg/dL — ABNORMAL HIGH (ref 70–99)
Potassium: 3.3 mmol/L — ABNORMAL LOW (ref 3.5–5.1)
Sodium: 135 mmol/L (ref 135–145)

## 2022-10-15 LAB — TROPONIN I (HIGH SENSITIVITY)
Troponin I (High Sensitivity): 3 ng/L (ref <18)
Troponin I (High Sensitivity): 5 ng/L (ref <18)

## 2022-10-15 LAB — LIPASE, BLOOD: Lipase: 23 U/L (ref 11–51)

## 2022-10-15 MED ORDER — ALUM & MAG HYDROXIDE-SIMETH 200-200-20 MG/5ML PO SUSP
15.0000 mL | Freq: Once | ORAL | Status: AC
  Administered 2022-10-15: 15 mL via ORAL
  Filled 2022-10-15: qty 30

## 2022-10-15 MED ORDER — FAMOTIDINE 20 MG PO TABS
10.0000 mg | ORAL_TABLET | Freq: Once | ORAL | Status: AC
  Administered 2022-10-15: 10 mg via ORAL
  Filled 2022-10-15: qty 1

## 2022-10-15 NOTE — Progress Notes (Signed)
Report given to Tresa Endo, RN ED Triage.

## 2022-10-15 NOTE — ED Notes (Signed)
Patient Alert and oriented to baseline. Stable and ambulatory to baseline. Patient verbalized understanding of the discharge instructions.  Patient belongings were taken by the patient.   

## 2022-10-15 NOTE — ED Provider Notes (Signed)
[place] EMERGENCY DEPARTMENT AT [facility] Provider Note   CSN: [id number] Arrival date & time: 10/15/22  0700     History Chief Complaint  Patient presents with   Chest Pain   Chest Pain    Heidi Ayala is a 32 y.o. female.  Patient presents emergency department complaints of chest pain.  She reports that she was seen last night at urgent care for sore throat, body aches, headache and given fluids as well as being swabbed for strep and flu which was negative.  Patient reports that around 1 AM today she began to experience chest pain and was able to get back to sleep.  Reports the chest pain is typically worse with movement.  Endorsing slight cough and nasal congestion.  Patient is currently about [redacted] weeks pregnant.  Patient is currently pregnant. [id number]. EDD 10/22/20024.    Chest Pain      Home Medications Prior to Admission medications   Medication Sig Start Date End Date Taking? Authorizing Provider  ibuprofen (ADVIL) 600 MG tablet Take 1 tablet (600 mg total) by mouth every 6 (six) hours. 09/03/19   Morris, Aundra Millet, DO  loratadine (CLARITIN) 10 MG tablet Take 10 mg by mouth daily as needed for allergies.    [provider]  Prenatal Vit-Fe Fumarate-FA (PRENATAL MULTIVITAMIN) TABS tablet Take 1 tablet by mouth daily.     [provider]      Allergies    Patient has no known allergies.    Review of Systems   Review of Systems  Cardiovascular:  Positive for chest pain.  All other systems reviewed and are negative.   Physical Exam Updated Vital Signs BP 114/65 (BP Location: Right Arm)   Pulse 100   Temp 98 F (36.7 C) (Oral)   Resp 18   Ht 5\' 5"  (1.651 m)   Wt 65.8 kg   LMP 05/22/2022   SpO2 97%   BMI 24.13 kg/m  Physical Exam Vitals and nursing note reviewed.  Constitutional:      General: She is not in acute distress.    Appearance: She is well-developed.  HENT:     Head: Normocephalic and atraumatic.  Eyes:      Conjunctiva/sclera: Conjunctivae normal.  Cardiovascular:     Rate and Rhythm: Normal rate and regular rhythm.     Heart sounds: No murmur heard. Pulmonary:     Effort: Pulmonary effort is normal. No respiratory distress.     Breath sounds: Normal breath sounds.  Abdominal:     Palpations: Abdomen is soft.     Tenderness: There is no abdominal tenderness.  Musculoskeletal:        General: No swelling.     Cervical back: Neck supple.  Skin:    General: Skin is warm and dry.     Capillary Refill: Capillary refill takes less than 2 seconds.  Neurological:     General: No focal deficit present.     Mental Status: She is alert.  Psychiatric:        Mood and Affect: Mood normal.     ED Results / Procedures / Treatments   Labs (all labs ordered are listed, but only abnormal results are displayed) Labs Reviewed  BASIC METABOLIC PANEL  CBC  LIPASE, BLOOD  TROPONIN I (HIGH SENSITIVITY)    EKG None  Radiology No results found.  Procedures Procedures   Medications Ordered in ED Medications - No data to display  ED Course/ Medical Decision Making/ A&P  Medical Decision Making Risk OTC drugs.   This patient presents to the ED for concern of *** differential diagnosis includes ***    Additional history obtained:  Additional history obtained from *** External records from outside source obtained and reviewed including ***   Lab Tests:  I Ordered, and personally interpreted labs.  The pertinent results include:  ***   Imaging Studies ordered:  I ordered imaging studies including ***  I independently visualized and interpreted imaging which showed *** I agree with the radiologist interpretation   Medicines ordered and prescription drug management:  I ordered medication including ***  for ***  Reevaluation of the patient after these medicines showed that the patient {resolved/improved/worsened:23923::"improved"} I have reviewed the  patients home medicines and have made adjustments as needed   Problem List / ED Course:  ***   Social Determinants of Health:   Final Clinical Impression(s) / ED Diagnoses Final diagnoses:  None    Rx / DC Orders ED Discharge Orders     None

## 2022-10-15 NOTE — Discharge Instructions (Addendum)
You were seen in the ER for chest pain. Your labs, imaging, and EKG were negative for any acute findings or changes concerning for heart involvement. I would encourage you to follow up with your OBGYN/PCP for further evaluation and to ensure symptoms are improving/resolving. If you have any acute worsening of your symptoms, please return to the ED.

## 2022-10-15 NOTE — MAU Provider Note (Signed)
Event Date/Time   First Provider Initiated Contact with Patient 10/15/22 0723      S [name] is a 32 y.o. [phone] patient who presents to MAU today with complaint of chest pain. She states she has been seen at [facility] and received fluids and treatment for strep.  She reports having dose of Amoxicillin last night and She reports onset of chest pain at 0100 that "wont let up."  She describes it as "someone squeezing from the inside out."  She describes the pain as constant. She rates the pain a 6/10.   She denies vaginal concerns and endorses fetal movement. FHR noted at 147 on doppler.   O BP 114/65 (BP Location: Right Arm)   Pulse 100   Temp 98 F (36.7 C) (Oral)   Resp 18   Ht 5\' 1"  (1.549 m)   Wt 65.8 kg   LMP 05/22/2022   SpO2 97%   BMI 27.42 kg/m  Physical Exam Vitals reviewed.  Constitutional:      General: She is not in acute distress.    Appearance: Normal appearance. She is not toxic-appearing.  HENT:     Head: Normocephalic and atraumatic.  Eyes:     Conjunctiva/sclera: Conjunctivae normal.  Pulmonary:     Effort: Pulmonary effort is normal. No respiratory distress.  Musculoskeletal:        General: Normal range of motion.     Cervical back: Normal range of motion.  Skin:    General: Skin is warm and dry.  Neurological:     Mental Status: She is alert and oriented to person, place, and time.  Psychiatric:        Mood and Affect: Mood normal.        Behavior: Behavior normal.     A Medical screening exam complete SIUP at 20.6 weeks Chest Pain  P -Patient informed that this is not pregnancy related complaint and would need to go to [facility] for further evaluation. -Patient agreeable with transfer. -Ernie Avena, MD contacted and given patient update. Accepts transfer. -Patient transferred to [facility] in stable condition.  -Patient may return to MAU as needed for pregnancy related concerns.   Gerrit Heck, CNM 10/15/2022 7:23 AM

## 2022-10-15 NOTE — ED Triage Notes (Signed)
C/o sorethroat , bodyaches , headache no fever was seen at [facility] got fluids , states she was swabbed for flu, strep all neg. Went home rested. 0100 c/o chest pain unable to get back to sleep, worse was movement. Slight cough , c/o nasal congestion, states she is 22 wks preg.  G 4 P2 AB 1 C/o chest pain at present,

## 2022-10-15 NOTE — MAU Note (Addendum)
Heidi Ayala is a 32 y.o. at [redacted]w[redacted]d here in MAU reporting: she's had chest pain located in the center of her chest since 0100 this morning.  Reports the pain is constant and feels like pressure and squeezing sensation. Reports seen yesterday in Urgent Care, diagnosed with strep. Denies current pregnancy related issues.  No LOF or VB. LMP: NA Onset of complaint: today Pain score: 6 Vitals:   10/15/22 0715  BP: 114/65  Pulse: 100  Resp: 18  Temp: 98 F (36.7 C)  SpO2: 97%     FHT: 147 bpm Lab orders placed from triage:   None

## 2022-12-24 ENCOUNTER — Inpatient Hospital Stay (HOSPITAL_COMMUNITY)
Admission: AD | Admit: 2022-12-24 | Discharge: 2022-12-24 | Disposition: A | Discharge status: Home / Self Care | Payer: Commercial Managed Care - PPO

## 2022-12-24 ENCOUNTER — Encounter (HOSPITAL_COMMUNITY): Payer: Self-pay | Admitting: Obstetrics and Gynecology

## 2022-12-24 DIAGNOSIS — O4703 False labor before 37 completed weeks of gestation, third trimester: Secondary | ICD-10-CM | POA: Diagnosis present

## 2022-12-24 DIAGNOSIS — O479 False labor, unspecified: Secondary | ICD-10-CM

## 2022-12-24 DIAGNOSIS — N898 Other specified noninflammatory disorders of vagina: Secondary | ICD-10-CM

## 2022-12-24 DIAGNOSIS — Z0371 Encounter for suspected problem with amniotic cavity and membrane ruled out: Secondary | ICD-10-CM | POA: Insufficient documentation

## 2022-12-24 DIAGNOSIS — Z3A30 30 weeks gestation of pregnancy: Secondary | ICD-10-CM

## 2022-12-24 DIAGNOSIS — Z3493 Encounter for supervision of normal pregnancy, unspecified, third trimester: Secondary | ICD-10-CM

## 2022-12-24 HISTORY — DX: Anemia, unspecified: D64.9

## 2022-12-24 LAB — POCT FERN TEST: POCT Fern Test: NEGATIVE

## 2022-12-24 LAB — WET PREP, GENITAL
Clue Cells Wet Prep HPF POC: NONE SEEN
Sperm: NONE SEEN
Trich, Wet Prep: NONE SEEN
WBC, Wet Prep HPF POC: <10 (ref <10)
Yeast Wet Prep HPF POC: NONE SEEN

## 2022-12-24 LAB — URINALYSIS, ROUTINE W REFLEX MICROSCOPIC
Bilirubin Urine: NEGATIVE
Glucose, UA: NEGATIVE mg/dL
Hgb urine dipstick: NEGATIVE
Ketones, ur: NEGATIVE mg/dL
Leukocytes,Ua: NEGATIVE
Nitrite: NEGATIVE
Protein, ur: NEGATIVE mg/dL
Specific Gravity, Urine: 1.005 (ref 1.005–1.030)
pH: 6 (ref 5.0–8.0)

## 2022-12-24 NOTE — MAU Provider Note (Signed)
History     CSN: [id number]  Arrival date and time: 12/24/22 1932   None     Chief Complaint  Patient presents with   Contractions   Rupture of Membranes   HPI Heidi Ayala is a 32 y.o. [id number] at [redacted]w[redacted]d presents to MAU for contractions and leaking fluid. She reports at 1630 she had a small gush of clear fluid. She reports it was enough to make underwear wet. She changed her underwear and noticed a few small drops again. She reports around 1700 she started having some tightening in her abdomen that feels like contractions- very mild. She reports they feel every 5-7 minutes apart. She denies vaginal bleeding, itching, odor, or urinary s/s. No recent intercourse. She reports normal fetal movement.  Pregnancy course: uncomplicated pregnancy. No hx of preterm labor. She receives [facility], next appointment is on 8/21.   OB History     Gravida  4   Para  2   Term  2   Preterm      AB  1   Living  2      SAB  1   IAB      Ectopic      Multiple  0   Live Births  1           Past Medical History:  Diagnosis Date   Anemia     Past Surgical History:  Procedure Laterality Date   NO PAST SURGERIES     URETHRAL CYST REMOVAL      Family History  Problem Relation Age of Onset   Cancer Paternal Aunt    Hypertension Maternal Grandmother    Cancer Paternal Grandfather     Social History   Tobacco Use   Smoking status: Never   Smokeless tobacco: Never  Vaping Use   Vaping status: Never Used  Substance Use Topics   Alcohol use: No   Drug use: No    Allergies: No Known Allergies  No medications prior to admission.   Review of Systems  Gastrointestinal:  Positive for abdominal pain (tightening).  Genitourinary:  Positive for vaginal discharge.  All other systems reviewed and are negative.  Physical Exam   Patient Vitals for the past 24 hrs:  BP Temp Temp src Pulse Resp SpO2 Height Weight  12/24/22 2237 112/62 -- -- 88 17  100 % -- --  12/24/22 2230 -- -- -- -- -- 99 % -- --  12/24/22 2225 -- -- -- -- -- 99 % -- --  12/24/22 2220 -- -- -- -- -- 99 % -- --  12/24/22 2215 -- -- -- -- -- 99 % -- --  12/24/22 2116 122/78 -- -- 88 -- 100 % -- --  12/24/22 2112 -- -- -- -- -- 100 % -- --  12/24/22 2108 118/70 -- -- 81 17 100 % -- --  12/24/22 2027 122/73 98.9 F (37.2 C) Oral 91 19 100 % 5\' 5"  (1.651 m) 73.9 kg   Physical Exam Vitals and nursing note reviewed. Exam conducted with a chaperone present.  Constitutional:      General: She is not in acute distress. Eyes:     Extraocular Movements: Extraocular movements intact.     Pupils: Pupils are equal, round, and reactive to light.  Cardiovascular:     Rate and Rhythm: Normal rate.  Pulmonary:     Effort: Pulmonary effort is normal. No respiratory distress.  Abdominal:     Palpations: Abdomen  is soft.     Tenderness: There is no abdominal tenderness.     Comments: Gravid   Genitourinary:    Comments: Normal external female genitalia, vaginal walls pink and well-rugated, no pooling of amniotic fluid, small amount of thin white discharge, no bleeding, cervix visually closed without lesions/masses Musculoskeletal:        General: Normal range of motion.     Cervical back: Normal range of motion.  Skin:    General: Skin is warm and dry.  Neurological:     General: No focal deficit present.     Mental Status: She is alert and oriented to person, place, and time.  Psychiatric:        Mood and Affect: Mood normal.        Behavior: Behavior normal.   Dilation: Closed Effacement (%): Thick Cervical Position: Posterior Exam by:: Camelia Eng CNM  NST FHR: 125 bpm, moderate variability, +15x15 accels, no decels Toco: ui  Results for orders placed or performed during the hospital encounter of 12/24/22 (from the past 24 hour(s))  Urinalysis, Routine w reflex microscopic -Urine, Clean Catch     Status: Abnormal   Collection Time: 12/24/22  9:19 PM   Result Value Ref Range   Color, Urine STRAW (A) YELLOW   APPearance CLEAR CLEAR   Specific Gravity, Urine 1.005 1.005 - 1.030   pH 6.0 5.0 - 8.0   Glucose, UA NEGATIVE NEGATIVE mg/dL   Hgb urine dipstick NEGATIVE NEGATIVE   Bilirubin Urine NEGATIVE NEGATIVE   Ketones, ur NEGATIVE NEGATIVE mg/dL   Protein, ur NEGATIVE NEGATIVE mg/dL   Nitrite NEGATIVE NEGATIVE   Leukocytes,Ua NEGATIVE NEGATIVE  Wet prep, genital     Status: None   Collection Time: 12/24/22  9:20 PM   Specimen: Cervix  Result Value Ref Range   Yeast Wet Prep HPF POC NONE SEEN NONE SEEN   Trich, Wet Prep NONE SEEN NONE SEEN   Clue Cells Wet Prep HPF POC NONE SEEN NONE SEEN   WBC, Wet Prep HPF POC <10 <10   Sperm NONE SEEN   Fern Test     Status: None   Collection Time: 12/24/22  9:22 PM  Result Value Ref Range   POCT Fern Test Negative = intact amniotic membranes     MAU Course  Procedures  MDM UA Wet prep, GC/CT Fern PO hydration  No evidence of pooling on spec exam, fern slide negative. Cervix closed. Wet prep negative. Patient noted to have some irritability on toco. Discussed options of PO hydration vs IV hydration vs anti-tocolytics. Patient opts to avoid IV fluids and medications if possible. Will try PO hydration.   Patient continued to have regular uc's and ui. Recommended Procardia- patient declines as contractions not painful and wants to avoid medications if possible. Continued PO hydration.   Contractions spaced out after hydration. Cervix remains closed/thick.   Assessment and Plan   1. [redacted] weeks gestation of pregnancy   2. Uterine contractions   3. Vaginal discharge during pregnancy in third trimester   4. Intact amniotic membranes during pregnancy in third trimester    - Discharge home in stable condition - Strict return precautions. Return to MAU as needed - Keep OB appointment as scheduled on 8/21  GERMANY TILFORD, CNM 12/24/2022, 11:16 PM

## 2022-12-24 NOTE — MAU Note (Signed)
..  Heidi Ayala is a 32 y.o. at [redacted]w[redacted]d here in MAU reporting:  Leaking of clear fluid since 1630 Irregular contractions for 2 hours. +FM Denies vaginal bleeding.    Pain score: 4/10 Vitals:   12/24/22 2027  BP: 122/73  Pulse: 91  Resp: 19  Temp: 98.9 F (37.2 C)  SpO2: 100%     FHT:125 Lab orders placed from triage:  UA

## 2022-12-25 LAB — GC/CHLAMYDIA PROBE AMP (~~LOC~~) NOT AT ARMC
Chlamydia: NEGATIVE
Comment: NEGATIVE
Comment: NORMAL
Neisseria Gonorrhea: NEGATIVE

## 2023-02-03 ENCOUNTER — Other Ambulatory Visit: Payer: Self-pay

## 2023-02-03 ENCOUNTER — Encounter (HOSPITAL_COMMUNITY): Payer: Self-pay | Admitting: Obstetrics and Gynecology

## 2023-02-03 ENCOUNTER — Inpatient Hospital Stay (HOSPITAL_COMMUNITY)
Admission: AD | Admit: 2023-02-03 | Discharge: 2023-02-03 | Disposition: A | Discharge status: Home / Self Care | Payer: Commercial Managed Care - PPO | Attending: Obstetrics and Gynecology | Admitting: Obstetrics and Gynecology

## 2023-02-03 DIAGNOSIS — Z3A38 38 weeks gestation of pregnancy: Secondary | ICD-10-CM | POA: Diagnosis not present

## 2023-02-03 DIAGNOSIS — Z0371 Encounter for suspected problem with amniotic cavity and membrane ruled out: Secondary | ICD-10-CM | POA: Diagnosis not present

## 2023-02-03 DIAGNOSIS — O4703 False labor before 37 completed weeks of gestation, third trimester: Secondary | ICD-10-CM | POA: Insufficient documentation

## 2023-02-03 DIAGNOSIS — O479 False labor, unspecified: Secondary | ICD-10-CM

## 2023-02-03 DIAGNOSIS — Z3A36 36 weeks gestation of pregnancy: Secondary | ICD-10-CM | POA: Insufficient documentation

## 2023-02-03 LAB — RUPTURE OF MEMBRANE (ROM)PLUS: Rom Plus: NEGATIVE

## 2023-02-03 NOTE — MAU Note (Signed)
.  Heidi Ayala is a 32 y.o. at [redacted]w[redacted]d here in MAU reporting: Irregular contractions that aren't painful every 15-30 minutes, and clear watery vaginal discharge LMP: 05/2022 Onset of complaint: 02/02/23 Pain score: 0/10 Vitals:   02/03/23 1556  BP: 129/79  Pulse: 86  Resp: 16  Temp: 98.6 F (37 C)     FHT:125 Lab orders placed from triage:  UA

## 2023-02-03 NOTE — MAU Provider Note (Signed)
  History     CSN: [id number]  Arrival date and time: 02/03/23 1533    Chief Complaint  Patient presents with   Contractions    Sporadic since yesterday 15-55mins   Rupture of Membranes    Has a trickle she noticed this morning. No Bleeding or color to the fluid   HPI  Unsure if water has broken. Has had an intermittent trickle of clear fluid since this AM. Having rare contractions which are uncomfortable but not painful in this time frame. No vaginal bleeding, itching, bleeding. +FM.  Past Medical History:  Diagnosis Date   Anemia     Past Surgical History:  Procedure Laterality Date   NO PAST SURGERIES     URETHRAL CYST REMOVAL      Family History  Problem Relation Age of Onset   Cancer Paternal Aunt    Hypertension Maternal Grandmother    Cancer Paternal Grandfather     Social History   Tobacco Use   Smoking status: Never   Smokeless tobacco: Never  Vaping Use   Vaping status: Never Used  Substance Use Topics   Alcohol use: No   Drug use: No    Allergies: No Known Allergies  Medications Prior to Admission  Medication Sig Dispense Refill Last Dose   Prenatal Vit-Fe Fumarate-FA (PRENATAL MULTIVITAMIN) TABS tablet Take 1 tablet by mouth daily.    02/02/2023   loratadine (CLARITIN) 10 MG tablet Take 10 mg by mouth daily as needed for allergies.   More than a month    Review of Systems See HPI.  Physical Exam   Blood pressure 129/79, pulse 86, temperature 98.6 F (37 C), temperature source Oral, resp. rate 16, height 5\' 4"  (1.626 m), weight 77.1 kg, last menstrual period 05/22/2022, unknown if currently breastfeeding.  Physical Exam Constitutional:      General: She is not in acute distress.    Appearance: She is not ill-appearing.  Cardiovascular:     Rate and Rhythm: Normal rate and regular rhythm.  Pulmonary:     Effort: Pulmonary effort is normal.  Abdominal:     Comments: Gravid  Neurological:     Mental Status: Mental status is at  baseline.  Psychiatric:        Mood and Affect: Mood normal.   Cervix 2/60/-3 x2 by RN  ROM plus negative.  FWB: Cat I tracing. Baseline 135/moderate variability/+accels/no decels  MAU Course  Procedures  MDM Fern swab was dry, so repeat swab with ROM plus performed. Returned negative. Patient's cervix remained unchanged.   Assessment and Plan   #False labor/no ROM: s/sx of labor discussed and return precautions. Patient expressed verbal understanding. Has scheduled OB visit Thursday.  Joanne Gavel 02/03/2023, 5:41 PM

## 2023-02-11 ENCOUNTER — Encounter (HOSPITAL_COMMUNITY): Payer: Self-pay | Admitting: Obstetrics and Gynecology

## 2023-02-11 ENCOUNTER — Inpatient Hospital Stay (HOSPITAL_COMMUNITY)
Admission: AD | Admit: 2023-02-11 | Discharge: 2023-02-11 | Disposition: A | Discharge status: Home / Self Care | Payer: Commercial Managed Care - PPO | Attending: Obstetrics and Gynecology | Admitting: Obstetrics and Gynecology

## 2023-02-11 DIAGNOSIS — O471 False labor at or after 37 completed weeks of gestation: Secondary | ICD-10-CM | POA: Insufficient documentation

## 2023-02-11 DIAGNOSIS — Z3A39 39 weeks gestation of pregnancy: Secondary | ICD-10-CM | POA: Insufficient documentation

## 2023-02-11 DIAGNOSIS — O479 False labor, unspecified: Secondary | ICD-10-CM

## 2023-02-11 LAB — WET PREP, GENITAL
Clue Cells Wet Prep HPF POC: NONE SEEN
Sperm: NONE SEEN
Trich, Wet Prep: NONE SEEN
WBC, Wet Prep HPF POC: >=10 — AB (ref <10)
Yeast Wet Prep HPF POC: NONE SEEN

## 2023-02-11 LAB — URINALYSIS, ROUTINE W REFLEX MICROSCOPIC
Bilirubin Urine: NEGATIVE
Glucose, UA: NEGATIVE mg/dL
Hgb urine dipstick: NEGATIVE
Ketones, ur: NEGATIVE mg/dL
Leukocytes,Ua: NEGATIVE
Nitrite: NEGATIVE
Protein, ur: NEGATIVE mg/dL
Specific Gravity, Urine: 1.005 (ref 1.005–1.030)
pH: 6 (ref 5.0–8.0)

## 2023-02-11 LAB — RUPTURE OF MEMBRANE (ROM)PLUS: Rom Plus: NEGATIVE

## 2023-02-11 LAB — POCT FERN TEST: POCT Fern Test: NEGATIVE

## 2023-02-11 NOTE — MAU Provider Note (Addendum)
R/o rupture     S [name] is a 32 y.o. [phone] pregnant female at [redacted]w[redacted]d who presents to MAU today with complaint of leaking fluid.  Yesterday evening noticed small LOF in trickle like fashion, states watery and clear in underwear.  Denies VB.  Ongoing irregular contractions slightly worse this morning but has since resolved.  Endorses +FM.   Receives care at [facility] for [employer] [facility]. Prenatal records reviewed.  Pertinent items noted in HPI and remainder of comprehensive ROS otherwise negative.   O BP 122/79   Pulse 90   Temp 98.9 F (37.2 C) (Oral)   Resp 16   Ht 5\' 5"  (1.651 m)   Wt 79.4 kg   LMP 05/22/2022   SpO2 98%   BMI 29.12 kg/m  Physical Exam Vitals and nursing note reviewed. Exam conducted with a chaperone present.  Constitutional:      General: She is not in acute distress.    Appearance: Normal appearance. She is not ill-appearing.  HENT:     Head: Normocephalic and atraumatic.     Right Ear: External ear normal.     Left Ear: External ear normal.     Nose: Nose normal.     Mouth/Throat:     Mouth: Mucous membranes are moist.     Pharynx: Oropharynx is clear.  Eyes:     Conjunctiva/sclera: Conjunctivae normal.  Cardiovascular:     Rate and Rhythm: Normal rate.  Pulmonary:     Effort: Pulmonary effort is normal. No respiratory distress.  Abdominal:     Tenderness: There is no abdominal tenderness.     Comments: gravid  Genitourinary:    General: Normal vulva.  Musculoskeletal:        General: No swelling. Normal range of motion.     Cervical back: Normal range of motion.  Skin:    General: Skin is warm and dry.  Neurological:     Mental Status: She is alert and oriented to person, place, and time. Mental status is at baseline.  Psychiatric:        Mood and Affect: Mood normal.        Behavior: Behavior normal.        Thought Content: Thought content normal.        Judgment: Judgment normal.      MDM: MAU Course:  R/O rupture -  Fern negative x 2 - Pooling neg - ROM + negative  UA = noninfectious  Wet Prep = neg GC collected   NST: 135bpm, moderate variability, +accels, no decels, no ctx, cat 1 Unchanged cervical exam  A&P: #39weeks #False Labor  Patient on call for IOL today, ruled out rupture x 3 today  Discharge from MAU in stable condition with strict/usual precautions Follow up at [facility] as scheduled for ongoing prenatal care  Allergies as of 02/11/2023   No Known Allergies      Medication List     TAKE these medications    ferrous sulfate 324 MG Tbec Take 324 mg by mouth.   loratadine 10 MG tablet Commonly known as: CLARITIN Take 10 mg by mouth daily as needed for allergies.   prenatal multivitamin Tabs tablet Take 1 tablet by mouth daily.        Hessie Dibble, MD 02/11/2023 12:42 PM

## 2023-02-11 NOTE — Discharge Instructions (Signed)
The [employer] This circuit takes at least 90 minutes to complete so clear your schedule and make mental preparations so you can relax in your environment.  The second step requires a lot of pillows so gather them up before beginning.  Before starting, you should empty your bladder! Have a nice drink nearby, and make sure it has a straw! If you are having contractions, this circuit should be done through contractions, try not to change positions between steps.  Step One: Open-kneeChest Stay in this position for 30 minutes, start in cat/cow, then drop your chest as low as you can to the bed or the floor and your bottom as high as you can. Knees should be fairly wide apart, and the angle between the torso/thighs should be wider than 90 degrees. Wiggle around, prop with lots of pillows and use this time to get totally relaxed. This position allows the baby to scoot out of the pelvis a bit and gives them room to rotate, shift their head position, etc. If the pregnant person finds it helpful, careful positioning with a rebozo under the belly, with gentle tension from a support person behind can help maintain this position for the full 30 minutes.  Step Two:ExaggeratedLeft SideLying Roll to your left side, bringing your top leg as high as possible and keeping your bottom leg straight. Roll forward as much as possible, again using a lot of pillows. Sink into the bed and relax some more. If you fall asleep, that's totally okay and you can stay there! If not, stay here for at least another half an hour. Try and get your top right leg up towards your head and get as rolled over onto your belly as much as possible. If you repeat the circuit during labor, try alternating left and right sides. We know the photo the left is actually right side... just flip the image in your head.  Step Three: Moving and Lunges Lunge, walk stairs facing sideways, 2 at a time, (have a spotter  downstairs of you!), take a walk outside with one foot on the curb and the other on the street, sit on a birth ball and hula- anything that's upright and putting your pelvis in open, asymmetrical positions. Spend at least 30 minutes doing this one as well to give your baby a chance to move down. If you are lunging or stair or curb walking, you should lunge/walk/go up stairs in the direction that feels better to you. The key with the lunge is that the toes of the higher leg and mom's belly button should be at right angles. Do not lunge over your knee, that closes the pelvis.  You got this!!!!!!!!!!!!!!!!!!!!!!! :)  

## 2023-02-11 NOTE — MAU Note (Signed)
.  Heidi Ayala is a 32 y.o. at [redacted]w[redacted]d here in MAU reporting: small amount of LOF since yesterday evening. She reports that she notices a small trickle and a small amount of clear, watery fluid in her underwear. She denies any VB. She has also been having some on-going, irregular ctx that were worse this morning, but have since become less painful. She reports 7 ctx in one hour this morning, but farther apart now. Reports +FM.  Last SVE on 10/3: 3/90/-1  Seen in MAU on 9/29 for clear, watery discharge with Crist Fat and ROM+ negative.   Onset of complaint: Yesterday Pain score: 6/10 Vitals:   02/11/23 1028  BP: 117/70  Pulse: 78  Resp: 16  Temp: 98.9 F (37.2 C)  SpO2: 98%     FHT:155 bpm Lab orders placed from triage:  MAU labor

## 2023-02-12 LAB — GC/CHLAMYDIA PROBE AMP (~~LOC~~) NOT AT ARMC
Chlamydia: NEGATIVE
Comment: NEGATIVE
Comment: NORMAL
Neisseria Gonorrhea: NEGATIVE

## 2023-02-14 ENCOUNTER — Inpatient Hospital Stay (HOSPITAL_COMMUNITY): Payer: Commercial Managed Care - PPO | Admitting: Anesthesiology

## 2023-02-14 ENCOUNTER — Inpatient Hospital Stay (HOSPITAL_COMMUNITY)
Admission: RE | Admit: 2023-02-14 | Discharge: 2023-02-15 | DRG: 807 | Disposition: A | Discharge status: Home / Self Care | Payer: Commercial Managed Care - PPO | Attending: Obstetrics and Gynecology | Admitting: Obstetrics and Gynecology

## 2023-02-14 ENCOUNTER — Other Ambulatory Visit: Payer: Self-pay

## 2023-02-14 ENCOUNTER — Encounter (HOSPITAL_COMMUNITY): Payer: Self-pay | Admitting: Obstetrics and Gynecology

## 2023-02-14 DIAGNOSIS — Z8249 Family history of ischemic heart disease and other diseases of the circulatory system: Secondary | ICD-10-CM

## 2023-02-14 DIAGNOSIS — O9902 Anemia complicating childbirth: Secondary | ICD-10-CM | POA: Diagnosis present

## 2023-02-14 DIAGNOSIS — Z3A39 39 weeks gestation of pregnancy: Secondary | ICD-10-CM

## 2023-02-14 DIAGNOSIS — Z3490 Encounter for supervision of normal pregnancy, unspecified, unspecified trimester: Principal | ICD-10-CM

## 2023-02-14 DIAGNOSIS — O26893 Other specified pregnancy related conditions, third trimester: Secondary | ICD-10-CM | POA: Diagnosis present

## 2023-02-14 LAB — TYPE AND SCREEN
ABO/RH(D): O POS
Antibody Screen: NEGATIVE

## 2023-02-14 LAB — CBC
HCT: 38.1 % (ref 36.0–46.0)
Hemoglobin: 12.7 g/dL (ref 12.0–15.0)
MCH: 29.3 pg (ref 26.0–34.0)
MCHC: 33.3 g/dL (ref 30.0–36.0)
MCV: 87.8 fL (ref 80.0–100.0)
Platelets: 245 10*3/uL (ref 150–400)
RBC: 4.34 MIL/uL (ref 3.87–5.11)
RDW: 14.8 % (ref 11.5–15.5)
WBC: 11.7 10*3/uL — ABNORMAL HIGH (ref 4.0–10.5)
nRBC: 0 % (ref 0.0–0.2)

## 2023-02-14 LAB — RPR: RPR Ser Ql: NONREACTIVE

## 2023-02-14 MED ORDER — DIBUCAINE (PERIANAL) 1 % EX OINT: 1.0000 | TOPICAL_OINTMENT | CUTANEOUS | Status: DC | PRN

## 2023-02-14 MED ORDER — OXYTOCIN-SODIUM CHLORIDE 30-0.9 UT/500ML-% IV SOLN: 2.5000 [IU]/h | INTRAVENOUS | Status: DC

## 2023-02-14 MED ORDER — PRENATAL MULTIVITAMIN CH
1.0000 | ORAL_TABLET | Freq: Every day | ORAL | Status: DC
  Administered 2023-02-15: 1 via ORAL
  Filled 2023-02-14: qty 1

## 2023-02-14 MED ORDER — OXYCODONE-ACETAMINOPHEN 5-325 MG PO TABS: 2.0000 | ORAL_TABLET | ORAL | Status: DC | PRN

## 2023-02-14 MED ORDER — ZOLPIDEM TARTRATE 5 MG PO TABS: 5.0000 mg | ORAL_TABLET | Freq: Every evening | ORAL | Status: DC | PRN

## 2023-02-14 MED ORDER — TERBUTALINE SULFATE 1 MG/ML IJ SOLN: 0.2500 mg | Freq: Once | INTRAMUSCULAR | Status: DC | PRN

## 2023-02-14 MED ORDER — EPHEDRINE 5 MG/ML INJ: 10.0000 mg | INTRAVENOUS | Status: DC | PRN

## 2023-02-14 MED ORDER — FENTANYL-BUPIVACAINE-NACL 0.5-0.125-0.9 MG/250ML-% EP SOLN
EPIDURAL | Status: DC | PRN
  Administered 2023-02-14: 12 mL/h via EPIDURAL

## 2023-02-14 MED ORDER — LACTATED RINGERS IV SOLN
500.0000 mL | INTRAVENOUS | Status: DC | PRN
  Administered 2023-02-14: 1000 mL via INTRAVENOUS

## 2023-02-14 MED ORDER — ONDANSETRON HCL 4 MG PO TABS: 4.0000 mg | ORAL_TABLET | ORAL | Status: DC | PRN

## 2023-02-14 MED ORDER — LACTATED RINGERS IV SOLN
500.0000 mL | Freq: Once | INTRAVENOUS | Status: AC
  Administered 2023-02-14: 500 mL via INTRAVENOUS

## 2023-02-14 MED ORDER — SENNOSIDES-DOCUSATE SODIUM 8.6-50 MG PO TABS
2.0000 | ORAL_TABLET | Freq: Every day | ORAL | Status: DC
  Administered 2023-02-15: 2 via ORAL
  Filled 2023-02-14: qty 2

## 2023-02-14 MED ORDER — ONDANSETRON HCL 4 MG/2ML IJ SOLN: 4.0000 mg | Freq: Four times a day (QID) | INTRAMUSCULAR | Status: DC | PRN

## 2023-02-14 MED ORDER — LACTATED RINGERS IV SOLN: INTRAVENOUS | Status: DC

## 2023-02-14 MED ORDER — DIPHENHYDRAMINE HCL 25 MG PO CAPS: 25.0000 mg | ORAL_CAPSULE | Freq: Four times a day (QID) | ORAL | Status: DC | PRN

## 2023-02-14 MED ORDER — LORATADINE 10 MG PO TABS: 10.0000 mg | ORAL_TABLET | Freq: Every day | ORAL | Status: DC | PRN

## 2023-02-14 MED ORDER — PHENYLEPHRINE 80 MCG/ML (10ML) SYRINGE FOR IV PUSH (FOR BLOOD PRESSURE SUPPORT): 80.0000 ug | PREFILLED_SYRINGE | INTRAVENOUS | Status: DC | PRN

## 2023-02-14 MED ORDER — FENTANYL-BUPIVACAINE-NACL 0.5-0.125-0.9 MG/250ML-% EP SOLN
12.0000 mL/h | EPIDURAL | Status: DC | PRN
  Filled 2023-02-14: qty 250

## 2023-02-14 MED ORDER — BENZOCAINE-MENTHOL 20-0.5 % EX AERO: 1.0000 | INHALATION_SPRAY | CUTANEOUS | Status: DC | PRN

## 2023-02-14 MED ORDER — EPHEDRINE 5 MG/ML INJ
10.0000 mg | INTRAVENOUS | Status: DC | PRN
  Filled 2023-02-14: qty 5

## 2023-02-14 MED ORDER — WITCH HAZEL-GLYCERIN EX PADS: 1.0000 | MEDICATED_PAD | CUTANEOUS | Status: DC | PRN

## 2023-02-14 MED ORDER — FLEET ENEMA RE ENEM: 1.0000 | ENEMA | RECTAL | Status: DC | PRN

## 2023-02-14 MED ORDER — OXYCODONE-ACETAMINOPHEN 5-325 MG PO TABS: 1.0000 | ORAL_TABLET | ORAL | Status: DC | PRN

## 2023-02-14 MED ORDER — MEDROXYPROGESTERONE ACETATE 150 MG/ML IM SUSP: 150.0000 mg | INTRAMUSCULAR | Status: DC | PRN

## 2023-02-14 MED ORDER — DIPHENHYDRAMINE HCL 50 MG/ML IJ SOLN: 12.5000 mg | INTRAMUSCULAR | Status: DC | PRN

## 2023-02-14 MED ORDER — PHENYLEPHRINE 80 MCG/ML (10ML) SYRINGE FOR IV PUSH (FOR BLOOD PRESSURE SUPPORT)
80.0000 ug | PREFILLED_SYRINGE | INTRAVENOUS | Status: DC | PRN
  Filled 2023-02-14: qty 10

## 2023-02-14 MED ORDER — ONDANSETRON HCL 4 MG/2ML IJ SOLN: 4.0000 mg | INTRAMUSCULAR | Status: DC | PRN

## 2023-02-14 MED ORDER — SIMETHICONE 80 MG PO CHEW: 80.0000 mg | CHEWABLE_TABLET | ORAL | Status: DC | PRN

## 2023-02-14 MED ORDER — SOD CITRATE-CITRIC ACID 500-334 MG/5ML PO SOLN: 30.0000 mL | ORAL | Status: DC | PRN

## 2023-02-14 MED ORDER — TETANUS-DIPHTH-ACELL PERTUSSIS 5-2.5-18.5 LF-MCG/0.5 IM SUSY
0.5000 mL | PREFILLED_SYRINGE | Freq: Once | INTRAMUSCULAR | Status: DC
  Filled 2023-02-14: qty 0.5

## 2023-02-14 MED ORDER — ACETAMINOPHEN 325 MG PO TABS: 650.0000 mg | ORAL_TABLET | ORAL | Status: DC | PRN

## 2023-02-14 MED ORDER — PRENATAL MULTIVITAMIN CH: 1.0000 | ORAL_TABLET | Freq: Every day | ORAL | Status: DC

## 2023-02-14 MED ORDER — MEASLES, MUMPS & RUBELLA VAC IJ SOLR
0.5000 mL | Freq: Once | INTRAMUSCULAR | Status: DC
  Filled 2023-02-14: qty 0.5

## 2023-02-14 MED ORDER — OXYTOCIN BOLUS FROM INFUSION
333.0000 mL | Freq: Once | INTRAVENOUS | Status: AC
  Administered 2023-02-14: 333 mL via INTRAVENOUS

## 2023-02-14 MED ORDER — FENTANYL CITRATE (PF) 100 MCG/2ML IJ SOLN: 50.0000 ug | INTRAMUSCULAR | Status: DC | PRN

## 2023-02-14 MED ORDER — LIDOCAINE-EPINEPHRINE (PF) 1.5 %-1:200000 IJ SOLN
INTRAMUSCULAR | Status: DC | PRN
Start: 2023-02-14 — End: 2023-02-19
  Administered 2023-02-14: 5 mL via EPIDURAL

## 2023-02-14 MED ORDER — COCONUT OIL OIL: 1.0000 | TOPICAL_OIL | Status: DC | PRN

## 2023-02-14 MED ORDER — OXYTOCIN-SODIUM CHLORIDE 30-0.9 UT/500ML-% IV SOLN
1.0000 m[IU]/min | INTRAVENOUS | Status: DC
  Administered 2023-02-14: 2 m[IU]/min via INTRAVENOUS
  Filled 2023-02-14: qty 500

## 2023-02-14 MED ORDER — LIDOCAINE HCL (PF) 1 % IJ SOLN: 30.0000 mL | INTRAMUSCULAR | Status: DC | PRN

## 2023-02-14 MED ORDER — IBUPROFEN 600 MG PO TABS
600.0000 mg | ORAL_TABLET | Freq: Four times a day (QID) | ORAL | Status: DC
  Administered 2023-02-14 – 2023-02-15 (×4): 600 mg via ORAL
  Filled 2023-02-14 (×5): qty 1

## 2023-02-14 MED ORDER — MISOPROSTOL 25 MCG QUARTER TABLET
25.0000 ug | ORAL_TABLET | ORAL | Status: DC | PRN
  Administered 2023-02-14: 25 ug via VAGINAL
  Filled 2023-02-14: qty 1

## 2023-02-14 NOTE — Anesthesia Procedure Notes (Signed)
Epidural Patient location during procedure: OB Start time: 02/14/2023 9:35 AM End time: 02/14/2023 9:43 AM  Staffing Anesthesiologist: Atilano Median, DO Performed: anesthesiologist   Preanesthetic Checklist Completed: patient identified, IV checked, site marked, risks and benefits discussed, surgical consent, monitors and equipment checked, pre-op evaluation and timeout performed  Epidural Patient position: sitting Prep: ChloraPrep Patient monitoring: heart rate, continuous pulse ox and blood pressure Approach: midline Location: L3-L4 Injection technique: LOR saline  Needle:  Needle type: Tuohy  Needle gauge: 17 G Needle length: 9 cm Needle insertion depth: 6 cm Catheter type: closed end flexible Catheter size: 20 Guage Catheter at skin depth: 12 cm Test dose: negative and 1.5% lidocaine with Epi 1:200 K  Assessment Events: blood not aspirated, no cerebrospinal fluid, injection not painful, no injection resistance and no paresthesia  Additional Notes  Patient identified. Risks/Benefits/Options discussed with patient including but not limited to bleeding, infection, nerve damage, paralysis, failed block, incomplete pain control, headache, blood pressure changes, nausea, vomiting, reactions to medications, itching and postpartum back pain. Confirmed with bedside nurse the patient's most recent platelet count. Confirmed with patient that they are not currently taking any anticoagulation, have any bleeding history or any family history of bleeding disorders. Patient expressed understanding and wished to proceed. All questions were answered. Sterile technique was used throughout the entire procedure. Please see nursing notes for vital signs. Test dose was given through epidural catheter and negative prior to continuing to dose epidural or start infusion. Warning signs of high block given to the patient including shortness of breath, tingling/numbness in hands, complete motor  block, or any concerning symptoms with instructions to call for help. Patient was given instructions on fall risk and not to get out of bed. All questions and concerns addressed with instructions to call with any issues or inadequate analgesia.    Reason for block:procedure for pain

## 2023-02-14 NOTE — Plan of Care (Signed)

## 2023-02-14 NOTE — Anesthesia Postprocedure Evaluation (Signed)
Anesthesia Post Note  Patient: Heidi Ayala  Procedure(s) Performed: AN AD HOC LABOR EPIDURAL     Patient location during evaluation: Mother Baby Anesthesia Type: Epidural Level of consciousness: awake and alert and oriented Pain management: satisfactory to patient Vital Signs Assessment: post-procedure vital signs reviewed and stable Respiratory status: respiratory function stable Cardiovascular status: stable Postop Assessment: no headache, no backache, epidural receding, patient able to bend at knees, no signs of nausea or vomiting, adequate PO intake and able to ambulate Anesthetic complications: no   No notable events documented.  Last Vitals:  Vitals:   02/14/23 1425 02/14/23 1530  BP: 114/67 114/73  Pulse: 79 85  Resp: 18 18  Temp: 37.1 C 36.8 C  SpO2: 97% 100%    Last Pain:  Vitals:   02/14/23 1530  TempSrc: Oral  PainSc:    Pain Goal: Patients Stated Pain Goal: 0 (02/14/23 1008)                 Jaymie Mckiddy

## 2023-02-14 NOTE — H&P (Signed)
Heidi Ayala is a 32 y.o. female presenting for eiol.  Hx of rapid labor.  GBS neg. OB History     Gravida  4   Para  2   Term  2   Preterm      AB  1   Living  2      SAB  1   IAB      Ectopic      Multiple  0   Live Births  1          Past Medical History:  Diagnosis Date   Anemia    Past Surgical History:  Procedure Laterality Date   NO PAST SURGERIES     URETHRAL CYST REMOVAL     Family History: family history includes Cancer in her paternal aunt and paternal grandfather; Hypertension in her maternal grandmother. Social History:  reports that she has never smoked. She has never used smokeless tobacco. She reports that she does not drink alcohol and does not use drugs.     Maternal Diabetes: No Genetic Screening: Normal Maternal Ultrasounds/Referrals: Normal Fetal Ultrasounds or other Referrals:  None Maternal Substance Abuse:  No Significant Maternal Medications:  None Significant Maternal Lab Results:  Group B Strep negative Number of Prenatal Visits:greater than 3 verified prenatal visits Maternal Vaccinations:TDap Other Comments:  None  Review of Systems History Dilation: 4 Effacement (%): 80 Station: -2 Exam by:: Dr. Rana Snare Blood pressure 104/66, pulse 84, temperature 98.7 F (37.1 C), temperature source Oral, resp. rate 16, height 5\' 5"  (1.651 m), weight 79.3 kg, last menstrual period 05/22/2022, unknown if currently breastfeeding. Exam Physical Exam  Vitals and nursing note reviewed. Exam conducted with a chaperone present.  Constitutional:      Appearance: Normal appearance.  HENT:     Head: Normocephalic.  Eyes:     Pupils: Pupils are equal, round, and reactive to light.  Cardiovascular:     Rate and Rhythm: Normal rate and regular rhythm.     Pulses: Normal pulses.  Abdominal:     General: Abdomen is Gravid, nontender Neurological:     Mental Status: She is alert. Prenatal labs: ABO, Rh: --/--/O POS (10/10  0121) Antibody: NEG (10/10 0121) Rubella:   RPR: NON REACTIVE (10/10 0121)  HBsAg:    HIV:    GBS:     Assessment/Plan: IUP at 39 weeks eIOL - s/p cytotec.  Now with AROM and wants CLEA Anticipate SVD   Heidi Ayala 02/14/2023, 8:57 AM

## 2023-02-14 NOTE — Anesthesia Preprocedure Evaluation (Signed)
Anesthesia Evaluation  Patient identified by MRN, date of birth, ID band Patient awake    Reviewed: Allergy & Precautions, NPO status , Patient's Chart, lab work & pertinent test results  Airway Mallampati: II  TM Distance: >3 FB Neck ROM: Full    Dental no notable dental hx.    Pulmonary neg pulmonary ROS   Pulmonary exam normal        Cardiovascular negative cardio ROS  Rhythm:Regular Rate:Normal     Neuro/Psych negative neurological ROS  negative psych ROS   GI/Hepatic negative GI ROS, Neg liver ROS,,,  Endo/Other  negative endocrine ROS    Renal/GU negative Renal ROS  negative genitourinary   Musculoskeletal negative musculoskeletal ROS (+)    Abdominal Normal abdominal exam  (+)   Peds  Hematology  (+) Blood dyscrasia, anemia   Anesthesia Other Findings   Reproductive/Obstetrics (+) Pregnancy                             Anesthesia Physical Anesthesia Plan  ASA: 2  Anesthesia Plan: Epidural   Post-op Pain Management:    Induction:   PONV Risk Score and Plan: 2 and Treatment may vary due to age or medical condition  Airway Management Planned: Natural Airway  Additional Equipment: None  Intra-op Plan:   Post-operative Plan:   Informed Consent: I have reviewed the patients History and Physical, chart, labs and discussed the procedure including the risks, benefits and alternatives for the proposed anesthesia with the patient or authorized representative who has indicated his/her understanding and acceptance.     Dental advisory given  Plan Discussed with:   Anesthesia Plan Comments:        Anesthesia Quick Evaluation

## 2023-02-15 LAB — CBC
HCT: 34.8 % — ABNORMAL LOW (ref 36.0–46.0)
Hemoglobin: 11.3 g/dL — ABNORMAL LOW (ref 12.0–15.0)
MCH: 28.3 pg (ref 26.0–34.0)
MCHC: 32.5 g/dL (ref 30.0–36.0)
MCV: 87 fL (ref 80.0–100.0)
Platelets: 190 10*3/uL (ref 150–400)
RBC: 4 MIL/uL (ref 3.87–5.11)
RDW: 14.9 % (ref 11.5–15.5)
WBC: 9.8 10*3/uL (ref 4.0–10.5)
nRBC: 0 % (ref 0.0–0.2)

## 2023-02-15 MED ORDER — ACETAMINOPHEN 325 MG PO TABS: 650.0000 mg | ORAL_TABLET | ORAL | 1 refills | Status: AC | PRN

## 2023-02-15 MED ORDER — IBUPROFEN 600 MG PO TABS: 600.0000 mg | ORAL_TABLET | Freq: Four times a day (QID) | ORAL | 0 refills | Status: AC

## 2023-02-15 NOTE — Discharge Instructions (Signed)
WHAT TO LOOK OUT FOR: Fever of 100.4 or above Mastitis: feels like flu and breasts hurt Infection: increased pain, swelling or redness Blood clots golf ball size or larger Postpartum depression   Congratulations on your newest addition!

## 2023-02-15 NOTE — Lactation Note (Signed)
This note was copied from a baby's chart. Lactation Consultation Note  Patient Name: Heidi Ayala [id number] Date: 02/15/2023 Age:32 hours Reason for consult: Follow-up assessment;Term;Breastfeeding assistance  P3- MOB stated that infant has been sleepy due to his circumcision today. Infant hadn't eaten in a few hours, so LC assisted MOB with placing infant on the right breast in the football hold. After a few attempts, infant latched well, but needed frequent stimulation in order to get him nursing. Infant nursed for 5 min total.  LC reviewed feeding infant on cue 8-12x in 24 hrs, not allowing infant to go over 3 hrs without a feeding, CDC milk storage guidelines, LC services handout and engorgement/breast care protocols. LC encouraged MOB to call lactation team for further assistance as needed.  Maternal Data Has patient been taught Hand Expression?: Yes Does the patient have breastfeeding experience prior to this delivery?: Yes How long did the patient breastfeed?: a few days with her first child 10 years ago  Feeding Mother's Current Feeding Choice: Breast Milk and Formula  LATCH Score Latch: Repeated attempts needed to sustain latch, nipple held in mouth throughout feeding, stimulation needed to elicit sucking reflex.  Audible Swallowing: A few with stimulation  Type of Nipple: Everted at rest and after stimulation  Comfort (Breast/Nipple): Soft / non-tender  Hold (Positioning): Assistance needed to correctly position infant at breast and maintain latch.  LATCH Score: 7   Lactation Tools Discussed/Used Tools: Flanges;Coconut oil Flange Size: 18 Pump Education: Milk Storage  Interventions Interventions: Breast feeding basics reviewed;Assisted with latch;Breast compression;Adjust position;Support pillows;Position options;Coconut oil;Education;LC Services brochure  Discharge Discharge Education: Engorgement and breast care;Warning signs for feeding baby Pump:  DEBP;Personal  Consult Status Consult Status: Complete Date: 02/15/23    Dema Severin BS, IBCLC 02/15/2023, 2:21 PM

## 2023-02-15 NOTE — Plan of Care (Signed)

## 2023-02-15 NOTE — Plan of Care (Signed)
Problem: Education: Goal: Knowledge of General Education information will improve Description: Including pain rating scale, medication(s)/side effects and non-pharmacologic comfort measures 02/15/2023 1114 by Donne Hazel, LPN Outcome: Adequate for Discharge 02/15/2023 1114 by Donne Hazel, LPN Outcome: Progressing 02/15/2023 0729 by Donne Hazel, LPN Outcome: Progressing   Problem: Health Behavior/Discharge Planning: Goal: Ability to manage health-related needs will improve 02/15/2023 1114 by Donne Hazel, LPN Outcome: Adequate for Discharge 02/15/2023 1114 by Donne Hazel, LPN Outcome: Progressing 02/15/2023 0729 by Donne Hazel, LPN Outcome: Progressing   Problem: Clinical Measurements: Goal: Ability to maintain clinical measurements within normal limits will improve 02/15/2023 1114 by Donne Hazel, LPN Outcome: Adequate for Discharge 02/15/2023 1114 by Donne Hazel, LPN Outcome: Progressing 02/15/2023 0729 by Donne Hazel, LPN Outcome: Progressing Goal: Will remain free from infection 02/15/2023 1114 by Donne Hazel, LPN Outcome: Adequate for Discharge 02/15/2023 1114 by Donne Hazel, LPN Outcome: Progressing 02/15/2023 0729 by Donne Hazel, LPN Outcome: Progressing Goal: Diagnostic test results will improve 02/15/2023 1114 by Donne Hazel, LPN Outcome: Adequate for Discharge 02/15/2023 1114 by Donne Hazel, LPN Outcome: Progressing 02/15/2023 0729 by Donne Hazel, LPN Outcome: Progressing Goal: Respiratory complications will improve 02/15/2023 1114 by Donne Hazel, LPN Outcome: Adequate for Discharge 02/15/2023 1114 by Donne Hazel, LPN Outcome: Progressing 02/15/2023 0729 by Donne Hazel, LPN Outcome: Progressing Goal: Cardiovascular complication will be avoided 02/15/2023 1114 by Donne Hazel, LPN Outcome: Adequate for Discharge 02/15/2023 1114 by Donne Hazel, LPN Outcome: Progressing 02/15/2023 0729 by Donne Hazel, LPN Outcome: Progressing   Problem: Activity: Goal: Risk for activity intolerance will decrease 02/15/2023 1114 by Donne Hazel, LPN Outcome: Adequate for Discharge 02/15/2023 1114 by Donne Hazel, LPN Outcome: Progressing 02/15/2023 0729 by Donne Hazel, LPN Outcome: Progressing   Problem: Nutrition: Goal: Adequate nutrition will be maintained 02/15/2023 1114 by Donne Hazel, LPN Outcome: Adequate for Discharge 02/15/2023 1114 by Donne Hazel, LPN Outcome: Progressing 02/15/2023 0729 by Donne Hazel, LPN Outcome: Progressing   Problem: Coping: Goal: Level of anxiety will decrease 02/15/2023 1114 by Donne Hazel, LPN Outcome: Adequate for Discharge 02/15/2023 1114 by Donne Hazel, LPN Outcome: Progressing 02/15/2023 0729 by Donne Hazel, LPN Outcome: Progressing   Problem: Elimination: Goal: Will not experience complications related to bowel motility 02/15/2023 1114 by Donne Hazel, LPN Outcome: Adequate for Discharge 02/15/2023 1114 by Donne Hazel, LPN Outcome: Progressing 02/15/2023 0729 by Donne Hazel, LPN Outcome: Progressing Goal: Will not experience complications related to urinary retention 02/15/2023 1114 by Donne Hazel, LPN Outcome: Adequate for Discharge 02/15/2023 1114 by Donne Hazel, LPN Outcome: Progressing 02/15/2023 0729 by Donne Hazel, LPN Outcome: Progressing   Problem: Pain Managment: Goal: General experience of comfort will improve 02/15/2023 1114 by Donne Hazel, LPN Outcome: Adequate for Discharge 02/15/2023 1114 by Donne Hazel, LPN Outcome: Progressing 02/15/2023 0729 by Donne Hazel, LPN Outcome: Progressing   Problem: Safety: Goal: Ability to remain free from injury will improve 02/15/2023 1114 by Donne Hazel, LPN Outcome: Adequate for Discharge 02/15/2023 1114 by Donne Hazel, LPN Outcome: Progressing 02/15/2023 0729 by Donne Hazel, LPN Outcome: Progressing   Problem:  Skin Integrity: Goal: Risk for impaired skin integrity will decrease 02/15/2023 1114 by Donne Hazel, LPN Outcome: Adequate for Discharge 02/15/2023 1114 by Donne Hazel, LPN Outcome: Progressing 02/15/2023 0729 by Donne Hazel, LPN  Outcome: Progressing   Problem: Education: Goal: Knowledge of condition will improve 02/15/2023 1114 by Donne Hazel, LPN Outcome: Adequate for Discharge 02/15/2023 1114 by Donne Hazel, LPN Outcome: Progressing 02/15/2023 0729 by Donne Hazel, LPN Outcome: Progressing Goal: Individualized Educational Video(s) 02/15/2023 1114 by Donne Hazel, LPN Outcome: Adequate for Discharge 02/15/2023 1114 by Donne Hazel, LPN Outcome: Progressing 02/15/2023 0729 by Donne Hazel, LPN Outcome: Progressing Goal: Individualized Newborn Educational Video(s) 02/15/2023 1114 by Donne Hazel, LPN Outcome: Adequate for Discharge 02/15/2023 1114 by Donne Hazel, LPN Outcome: Progressing 02/15/2023 0729 by Donne Hazel, LPN Outcome: Progressing   Problem: Activity: Goal: Will verbalize the importance of balancing activity with adequate rest periods 02/15/2023 1114 by Donne Hazel, LPN Outcome: Adequate for Discharge 02/15/2023 1114 by Donne Hazel, LPN Outcome: Progressing 02/15/2023 0729 by Donne Hazel, LPN Outcome: Progressing Goal: Ability to tolerate increased activity will improve 02/15/2023 1114 by Donne Hazel, LPN Outcome: Adequate for Discharge 02/15/2023 1114 by Donne Hazel, LPN Outcome: Progressing 02/15/2023 0729 by Donne Hazel, LPN Outcome: Progressing   Problem: Coping: Goal: Ability to identify and utilize available resources and services will improve 02/15/2023 1114 by Donne Hazel, LPN Outcome: Adequate for Discharge 02/15/2023 1114 by Donne Hazel, LPN Outcome: Progressing 02/15/2023 0729 by Donne Hazel, LPN Outcome: Progressing   Problem: Life Cycle: Goal: Chance of risk for complications  during the postpartum period will decrease 02/15/2023 1114 by Donne Hazel, LPN Outcome: Adequate for Discharge 02/15/2023 1114 by Donne Hazel, LPN Outcome: Progressing 02/15/2023 0729 by Donne Hazel, LPN Outcome: Progressing   Problem: Role Relationship: Goal: Ability to demonstrate positive interaction with newborn will improve 02/15/2023 1114 by Donne Hazel, LPN Outcome: Adequate for Discharge 02/15/2023 1114 by Donne Hazel, LPN Outcome: Progressing 02/15/2023 0729 by Donne Hazel, LPN Outcome: Progressing   Problem: Skin Integrity: Goal: Demonstration of wound healing without infection will improve 02/15/2023 1114 by Donne Hazel, LPN Outcome: Adequate for Discharge 02/15/2023 1114 by Donne Hazel, LPN Outcome: Progressing 02/15/2023 0729 by Donne Hazel, LPN Outcome: Progressing

## 2023-02-15 NOTE — Progress Notes (Signed)
Postpartum Progress Note  S: No complaints. Feeling well. Lochia appropriate. No subjective fevers/chills.   O:     02/15/2023    5:56 AM 02/15/2023   12:41 AM 02/14/2023    8:20 PM  Vitals with BMI  Systolic 109 110 161  Diastolic 85 83 77  Pulse 79 70 75    Gen: NAD, A&O Pulm: NWOB Abd: soft, appropriately ttp, fundus firm and below Umb Ext: No evidence of DVT, trace edema b/l  Labs Recent Results (from the past 2160 hour(s))  Urinalysis, Routine w reflex microscopic -Urine, Clean Catch     Status: Abnormal   Collection Time: 12/24/22  9:19 PM  Result Value Ref Range   Color, Urine STRAW (A) YELLOW   APPearance CLEAR CLEAR   Specific Gravity, Urine 1.005 1.005 - 1.030   pH 6.0 5.0 - 8.0   Glucose, UA NEGATIVE NEGATIVE mg/dL   Hgb urine dipstick NEGATIVE NEGATIVE   Bilirubin Urine NEGATIVE NEGATIVE   Ketones, ur NEGATIVE NEGATIVE mg/dL   Protein, ur NEGATIVE NEGATIVE mg/dL   Nitrite NEGATIVE NEGATIVE   Leukocytes,Ua NEGATIVE NEGATIVE    Comment: Performed at [facility], [address] [place], [place] [address]  Wet prep, genital     Status: None   Collection Time: 12/24/22  9:20 PM   Specimen: Cervix  Result Value Ref Range   Yeast Wet Prep HPF POC NONE SEEN NONE SEEN   Trich, Wet Prep NONE SEEN NONE SEEN   Clue Cells Wet Prep HPF POC NONE SEEN NONE SEEN   WBC, Wet Prep HPF POC <10 <10   Sperm NONE SEEN     Comment: Performed at [facility], [address], [place], [place] [address]  Crist Fat Test     Status: None   Collection Time: 12/24/22  9:22 PM  Result Value Ref Range   POCT Fern Test Negative = intact amniotic membranes   GC/Chlamydia probe amp ([place])not at [facility]     Status: None   Collection Time: 12/24/22  9:23 PM  Result Value Ref Range   Neisseria Gonorrhea Negative    Chlamydia Negative    Comment Normal Reference Ranger Chlamydia - Negative    Comment      Normal Reference Range Neisseria Gonorrhea - Negative  Rupture of  Membrane (ROM) Plus     Status: None   Collection Time: 02/03/23  4:40 PM  Result Value Ref Range   Rom Plus NEGATIVE     Comment: Performed at [facility], [address], [place], [place] [address]  POCT fern test     Status: None   Collection Time: 02/11/23 10:40 AM  Result Value Ref Range   POCT Fern Test Negative = intact amniotic membranes   GC/Chlamydia probe amp ([place])not at [facility]     Status: None   Collection Time: 02/11/23 10:49 AM  Result Value Ref Range   Neisseria Gonorrhea Negative    Chlamydia Negative    Comment Normal Reference Ranger Chlamydia - Negative    Comment      Normal Reference Range Neisseria Gonorrhea - Negative  Wet prep, genital     Status: Abnormal   Collection Time: 02/11/23 11:19 AM  Result Value Ref Range   Yeast Wet Prep HPF POC NONE SEEN NONE SEEN   Trich, Wet Prep NONE SEEN NONE SEEN   Clue Cells Wet Prep HPF POC NONE SEEN NONE SEEN   WBC, Wet Prep HPF POC >=  10 (A) <10   Sperm NONE SEEN     Comment: Performed at [facility], [address], [place], [place] [address]  Rupture of Membrane (ROM) Plus     Status: None   Collection Time: 02/11/23 11:19 AM  Result Value Ref Range   Rom Plus NEGATIVE     Comment: Performed at [facility], [address] [place], [place] [address]  Urinalysis, Routine w reflex microscopic -Urine, Clean Catch     Status: Abnormal   Collection Time: 02/11/23 11:19 AM  Result Value Ref Range   Color, Urine STRAW (A) YELLOW   APPearance CLEAR CLEAR   Specific Gravity, Urine 1.005 1.005 - 1.030   pH 6.0 5.0 - 8.0   Glucose, UA NEGATIVE NEGATIVE mg/dL   Hgb urine dipstick NEGATIVE NEGATIVE   Bilirubin Urine NEGATIVE NEGATIVE   Ketones, ur NEGATIVE NEGATIVE mg/dL   Protein, ur NEGATIVE NEGATIVE mg/dL   Nitrite NEGATIVE NEGATIVE   Leukocytes,Ua NEGATIVE NEGATIVE    Comment: Performed at [facility], [address] [place], [place] [address]  CBC     Status: Abnormal   Collection  Time: 02/14/23  1:21 AM  Result Value Ref Range   WBC 11.7 (H) 4.0 - 10.5 K/uL   RBC 4.34 3.87 - 5.11 MIL/uL   Hemoglobin 12.7 12.0 - 15.0 g/dL   HCT 01.0 27.2 - 53.6 %   MCV 87.8 80.0 - 100.0 fL   MCH 29.3 26.0 - 34.0 pg   MCHC 33.3 30.0 - 36.0 g/dL   RDW 64.4 03.4 - 74.2 %   Platelets 245 150 - 400 K/uL   nRBC 0.0 0.0 - 0.2 %    Comment: Performed at [facility], [address], [place], [place] [address]  RPR     Status: None   Collection Time: 02/14/23  1:21 AM  Result Value Ref Range   RPR Ser Ql NON REACTIVE NON REACTIVE    Comment: Performed at [facility], [address], [place], [place] [address]  Type and screen [facility]     Status: None   Collection Time: 02/14/23  1:21 AM  Result Value Ref Range   ABO/RH(D) O POS    Antibody Screen NEG    Sample Expiration      02/17/2023,2359 Performed at [facility], [address] [place], [place] [address]   CBC     Status: Abnormal   Collection Time: 02/15/23  5:10 AM  Result Value Ref Range   WBC 9.8 4.0 - 10.5 K/uL   RBC 4.00 3.87 - 5.11 MIL/uL   Hemoglobin 11.3 (L) 12.0 - 15.0 g/dL   HCT 18.8 (L) 41.6 - 60.6 %   MCV 87.0 80.0 - 100.0 fL   MCH 28.3 26.0 - 34.0 pg   MCHC 32.5 30.0 - 36.0 g/dL   RDW 30.1 60.1 - 09.3 %   Platelets 190 150 - 400 K/uL   nRBC 0.0 0.0 - 0.2 %    Comment: Performed at [facility], [address] [place], [place] [address]     A/P:  PPD1 s/p SVD, doing well pp. AFVSS. Benign exam.  Desires circ - reviewed r/b. Circ done today.  Desire dc home today.  Jule Economy, MD

## 2023-02-15 NOTE — Lactation Note (Signed)
This note was copied from a baby's chart. Lactation Consultation Note  Patient Name: Heidi Ayala HQION'G Date: 02/15/2023 Age:32 hours Reason for consult: Initial assessment;1st time breastfeeding;Term Mom is holding baby swaddled. Mom stated baby has been spitting up recently. Mom stated the baby BF really good but she wasn't sure if he got anything or not and was fussy so she asked for formula because she felt like he was hungry.  W/mom's permission assessed breast. Noted the breast that baby BF on was significantly softer than the breast that hadn't been BF on. Mom was so excited when [facility] pointed that out to mom. Encouraged mom to assess for transfer before and after BF. Newborn feeding habits, behavior, STS, I&O, positioning, support, supplementation reviewed. Hand expression taught. Mom very excited to see colostrum easily expressed. Mom encouraged to feed baby 8-12 times/24 hours and with feeding cues.  Praised mom for feeding well. Mom didn't BF her first 2 children but wanted to try w/this baby. Encouraged mom to call for latch assistance.   Maternal Data Has patient been taught Hand Expression?: Yes Does the patient have breastfeeding experience prior to this delivery?: No  Feeding Mother's Current Feeding Choice: Breast Milk and Formula  LATCH Score       Type of Nipple: Everted at rest and after stimulation  Comfort (Breast/Nipple): Filling, red/small blisters or bruises, mild/mod discomfort (breast full feeling, softens w/BF)         Lactation Tools Discussed/Used    Interventions Interventions: Breast feeding basics reviewed;Breast massage;Hand express;Breast compression;Position options;Education;LC Services brochure  Discharge    Consult Status Consult Status: Follow-up Date: 02/15/23 Follow-up type: In-patient    Salimata Christenson, Diamond Nickel 02/15/2023, 12:16 AM

## 2023-02-15 NOTE — Discharge Summary (Signed)
Postpartum Discharge Summary  Date of Service updated 02/15/2023     Patient Name: Heidi Ayala DOB: [birth year] MRN: [id number]  Date of admission: 02/14/2023 Delivery date:02/14/2023 Delivering provider: Candice Camp Date of discharge: 02/15/2023  Admitting diagnosis: Term pregnancy [Z34.90] Intrauterine pregnancy: [redacted]w[redacted]d     Secondary diagnosis:  Principal Problem:   Term pregnancy  Additional problems: none    Discharge diagnosis: Term Pregnancy Delivered                                              Post partum procedures: none Augmentation: AROM and Pitocin Complications: None  Hospital course: Induction of Labor With Vaginal Delivery   32 y.o. yo [id number] at [redacted]w[redacted]d was admitted to the hospital 02/14/2023 for induction of labor.  Indication for induction: Elective.  Patient had an labor course complicated by none Membrane Rupture Time/Date: 8:55 AM,02/14/2023  Delivery Method:Vaginal, Spontaneous Operative Delivery:N/A Episiotomy: None Lacerations:  2nd degree;Perineal Details of delivery can be found in separate delivery note.  Patient had a postpartum course complicated by none. Patient is discharged home 02/15/23.  Newborn Data: Birth date:02/14/2023 Birth time:12:10 PM Gender:Female Living status:Living Apgars:8 ,9  Weight:3620 g  Magnesium Sulfate received: No BMZ received: No Rhophylac:N/A MMR:N/A T-DaP:Given prenatally Flu: No RSV Vaccine received: No Transfusion:No Immunizations administered: Immunization History  Administered Date(s) Administered   Hepatitis A, Ped/Adol-2 Dose 09/08/2016   Hepatitis B, PED/ADOLESCENT 09/08/2016   Tdap 09/08/2016    Physical exam  Vitals:   02/14/23 1530 02/14/23 2020 02/15/23 0041 02/15/23 0556  BP: 114/73 110/77 110/83 109/85  Pulse: 85 75 70 79  Resp: 18 18 18 16   Temp: 98.2 F (36.8 C) 98.7 F (37.1 C) 98.1 F (36.7 C) 98.8 F (37.1 C)  TempSrc: Oral Oral Oral Oral  SpO2: 100% 97% 98% 99%   Weight:      Height:       General: alert, cooperative, and no distress Lochia: appropriate Uterine Fundus: firm Incision: N/A DVT Evaluation: No evidence of DVT seen on physical exam. Labs: Lab Results  Component Value Date   WBC 9.8 02/15/2023   HGB 11.3 (L) 02/15/2023   HCT 34.8 (L) 02/15/2023   MCV 87.0 02/15/2023   PLT 190 02/15/2023      Latest Ref Rng & Units 10/15/2022    8:17 AM  CMP  Glucose 70 - 99 mg/dL 132   BUN 6 - 20 mg/dL <5   Creatinine 4.40 - 1.00 mg/dL 1.02   Sodium 725 - 366 mmol/L 135   Potassium 3.5 - 5.1 mmol/L 3.3   Chloride 98 - 111 mmol/L 105   CO2 22 - 32 mmol/L 21   Calcium 8.9 - 10.3 mg/dL 8.3    Edinburgh Score:    02/14/2023    3:30 PM  Edinburgh Postnatal Depression Scale Screening Tool  I have been able to laugh and see the funny side of things. 0  I have looked forward with enjoyment to things. 0  I have blamed myself unnecessarily when things went wrong. 0  I have been anxious or worried for no good reason. 0  I have felt scared or panicky for no good reason. 0  Things have been getting on top of me. 0  I have been so unhappy that I have had difficulty sleeping. 0  I have felt sad or miserable.  0  I have been so unhappy that I have been crying. 0  The thought of harming myself has occurred to me. 0  Edinburgh Postnatal Depression Scale Total 0      After visit meds:  Allergies as of 02/15/2023   No Known Allergies      Medication List     TAKE these medications    acetaminophen 325 MG tablet Commonly known as: Tylenol Take 2 tablets (650 mg total) by mouth every 4 (four) hours as needed (for pain scale < 4).   ferrous sulfate 324 MG Tbec Take 324 mg by mouth.   ibuprofen 600 MG tablet Commonly known as: ADVIL Take 1 tablet (600 mg total) by mouth every 6 (six) hours.   loratadine 10 MG tablet Commonly known as: CLARITIN Take 10 mg by mouth daily as needed for allergies.   prenatal multivitamin Tabs  tablet Take 1 tablet by mouth daily.         Discharge home in stable condition Infant Feeding: Bottle and Breast Infant Disposition:home with mother Discharge instruction: per After Visit Summary and Postpartum booklet. Activity: Advance as tolerated. Pelvic rest for 6 weeks.  Diet: routine diet Anticipated Birth Control: Unsure Postpartum Appointment:6 weeks Additional Postpartum F/U:  none Future Appointments:No future appointments.  02/15/2023 Tawni Levy, MD

## 2023-02-23 ENCOUNTER — Inpatient Hospital Stay (HOSPITAL_COMMUNITY): Payer: Commercial Managed Care - PPO

## 2023-03-12 ENCOUNTER — Telehealth (HOSPITAL_COMMUNITY): Payer: Self-pay

## 2023-03-12 NOTE — Telephone Encounter (Signed)
03/12/2023 1429  Name: Heidi Ayala MRN: [id number] DOB: 02-20-1991  Reason for Call:  Transition of Care Hospital Discharge Call  Contact Status: Patient Contact Status: Message  Language assistant needed: Interpreter Mode: Interpreter Not Needed        Follow-Up Questions:    Inocente Salles Postnatal Depression Scale:  In the Past 7 Days:    PHQ2-9 Depression Scale:     Discharge Follow-up:    Post-discharge interventions: NA  Signature  Signe Colt
